# Patient Record
Sex: Female | Born: 1970 | Race: White | Hispanic: No | Smoking: Current every day smoker
Health system: Southern US, Community
[De-identification: ages and names within clinical notes are randomized; demographics above are authoritative.]

## PROBLEM LIST (undated history)

## (undated) DIAGNOSIS — R911 Solitary pulmonary nodule: Secondary | ICD-10-CM

## (undated) DIAGNOSIS — Q796 Ehlers-Danlos syndrome, unspecified: Secondary | ICD-10-CM

## (undated) HISTORY — PX: LIVER SURGERY: SHX698

## (undated) HISTORY — PX: APPENDECTOMY: SHX54

## (undated) HISTORY — PX: ABDOMINAL HYSTERECTOMY: SHX81

---

## 2008-05-17 ENCOUNTER — Emergency Department (HOSPITAL_COMMUNITY): Admission: EM | Admit: 2008-05-17 | Discharge: 2008-05-17 | Payer: Self-pay | Admitting: Emergency Medicine

## 2008-06-08 ENCOUNTER — Emergency Department (HOSPITAL_COMMUNITY): Admission: EM | Admit: 2008-06-08 | Discharge: 2008-06-08 | Payer: Self-pay | Admitting: Emergency Medicine

## 2009-04-10 ENCOUNTER — Emergency Department (HOSPITAL_COMMUNITY): Admission: EM | Admit: 2009-04-10 | Discharge: 2009-04-10 | Payer: Self-pay | Admitting: Emergency Medicine

## 2009-06-15 ENCOUNTER — Emergency Department (HOSPITAL_COMMUNITY): Admission: EM | Admit: 2009-06-15 | Discharge: 2009-06-15 | Payer: Self-pay | Admitting: Emergency Medicine

## 2009-11-09 ENCOUNTER — Encounter: Payer: Self-pay | Admitting: Internal Medicine

## 2009-11-10 ENCOUNTER — Ambulatory Visit: Payer: Self-pay | Admitting: Internal Medicine

## 2009-11-10 ENCOUNTER — Encounter (INDEPENDENT_AMBULATORY_CARE_PROVIDER_SITE_OTHER): Payer: Self-pay | Admitting: Internal Medicine

## 2009-11-10 ENCOUNTER — Inpatient Hospital Stay (HOSPITAL_COMMUNITY): Admission: EM | Admit: 2009-11-10 | Discharge: 2009-11-11 | Payer: Self-pay | Admitting: Emergency Medicine

## 2009-11-11 ENCOUNTER — Encounter: Payer: Self-pay | Admitting: Internal Medicine

## 2010-04-29 ENCOUNTER — Inpatient Hospital Stay (HOSPITAL_COMMUNITY): Admission: EM | Admit: 2010-04-29 | Discharge: 2010-05-01 | Payer: Self-pay | Admitting: Emergency Medicine

## 2010-04-29 ENCOUNTER — Encounter (INDEPENDENT_AMBULATORY_CARE_PROVIDER_SITE_OTHER): Payer: Self-pay | Admitting: Surgery

## 2010-05-03 ENCOUNTER — Ambulatory Visit: Payer: Self-pay | Admitting: Surgery

## 2010-05-03 ENCOUNTER — Encounter (INDEPENDENT_AMBULATORY_CARE_PROVIDER_SITE_OTHER): Payer: Self-pay | Admitting: Emergency Medicine

## 2010-05-03 ENCOUNTER — Emergency Department (HOSPITAL_COMMUNITY): Admission: EM | Admit: 2010-05-03 | Discharge: 2010-05-03 | Payer: Self-pay | Admitting: Emergency Medicine

## 2010-09-05 ENCOUNTER — Encounter: Admission: RE | Admit: 2010-09-05 | Discharge: 2010-09-05 | Payer: Self-pay | Admitting: Obstetrics

## 2010-09-09 ENCOUNTER — Inpatient Hospital Stay (HOSPITAL_COMMUNITY): Admission: AD | Admit: 2010-09-09 | Discharge: 2010-09-09 | Payer: Self-pay | Admitting: Obstetrics & Gynecology

## 2010-09-09 DIAGNOSIS — N938 Other specified abnormal uterine and vaginal bleeding: Secondary | ICD-10-CM

## 2010-09-09 DIAGNOSIS — N949 Unspecified condition associated with female genital organs and menstrual cycle: Secondary | ICD-10-CM

## 2010-09-09 DIAGNOSIS — D259 Leiomyoma of uterus, unspecified: Secondary | ICD-10-CM

## 2010-09-09 DIAGNOSIS — R109 Unspecified abdominal pain: Secondary | ICD-10-CM

## 2010-10-23 ENCOUNTER — Ambulatory Visit (HOSPITAL_COMMUNITY): Admission: RE | Admit: 2010-10-23 | Discharge: 2010-10-24 | Payer: Self-pay | Admitting: Obstetrics & Gynecology

## 2010-10-23 ENCOUNTER — Encounter (INDEPENDENT_AMBULATORY_CARE_PROVIDER_SITE_OTHER): Payer: Self-pay | Admitting: Obstetrics & Gynecology

## 2011-02-06 LAB — CBC
HCT: 31.1 % — ABNORMAL LOW (ref 36.0–46.0)
HCT: 36.4 % (ref 36.0–46.0)
Hemoglobin: 10.6 g/dL — ABNORMAL LOW (ref 12.0–15.0)
Hemoglobin: 12.4 g/dL (ref 12.0–15.0)
MCH: 30.5 pg (ref 26.0–34.0)
MCH: 30.7 pg (ref 26.0–34.0)
MCHC: 34 g/dL (ref 30.0–36.0)
MCHC: 34.1 g/dL (ref 30.0–36.0)
MCV: 89.9 fL (ref 78.0–100.0)
MCV: 90.2 fL (ref 78.0–100.0)
Platelets: 249 10*3/uL (ref 150–400)
Platelets: 268 10*3/uL (ref 150–400)
RBC: 3.46 MIL/uL — ABNORMAL LOW (ref 3.87–5.11)
RBC: 4.05 MIL/uL (ref 3.87–5.11)
RDW: 14.4 % (ref 11.5–15.5)
RDW: 14.7 % (ref 11.5–15.5)
WBC: 10.7 10*3/uL — ABNORMAL HIGH (ref 4.0–10.5)
WBC: 16 10*3/uL — ABNORMAL HIGH (ref 4.0–10.5)

## 2011-02-06 LAB — COMPREHENSIVE METABOLIC PANEL
ALT: 32 U/L (ref 0–35)
AST: 28 U/L (ref 0–37)
Albumin: 3.4 g/dL — ABNORMAL LOW (ref 3.5–5.2)
Alkaline Phosphatase: 64 U/L (ref 39–117)
BUN: 5 mg/dL — ABNORMAL LOW (ref 6–23)
CO2: 26 mEq/L (ref 19–32)
Calcium: 8.2 mg/dL — ABNORMAL LOW (ref 8.4–10.5)
Chloride: 102 mEq/L (ref 96–112)
Creatinine, Ser: 0.71 mg/dL (ref 0.4–1.2)
GFR calc Af Amer: >60 mL/min (ref >60)
GFR calc non Af Amer: >60 mL/min (ref >60)
Glucose, Bld: 81 mg/dL (ref 70–99)
Potassium: 3.5 mEq/L (ref 3.5–5.1)
Sodium: 136 mEq/L (ref 135–145)
Total Bilirubin: 0.3 mg/dL (ref 0.3–1.2)
Total Protein: 6.3 g/dL (ref 6.0–8.3)

## 2011-02-06 LAB — PREGNANCY, URINE: Preg Test, Ur: NEGATIVE

## 2011-02-06 LAB — TYPE AND SCREEN
ABO/RH(D): O POS
Antibody Screen: NEGATIVE

## 2011-02-06 LAB — SURGICAL PCR SCREEN
MRSA, PCR: NEGATIVE
Staphylococcus aureus: NEGATIVE

## 2011-02-06 LAB — ABO/RH: ABO/RH(D): O POS

## 2011-02-07 LAB — DIFFERENTIAL
Basophils Absolute: 0 10*3/uL (ref 0.0–0.1)
Basophils Relative: 0 % (ref 0–1)
Eosinophils Absolute: 0.2 10*3/uL (ref 0.0–0.7)
Eosinophils Relative: 3 % (ref 0–5)
Lymphocytes Relative: 26 % (ref 12–46)
Lymphs Abs: 2.3 10*3/uL (ref 0.7–4.0)
Monocytes Absolute: 0.6 10*3/uL (ref 0.1–1.0)
Monocytes Relative: 6 % (ref 3–12)
Neutro Abs: 5.9 10*3/uL (ref 1.7–7.7)
Neutrophils Relative %: 65 % (ref 43–77)

## 2011-02-07 LAB — CBC
HCT: 35.3 % — ABNORMAL LOW (ref 36.0–46.0)
Hemoglobin: 12 g/dL (ref 12.0–15.0)
MCH: 30.7 pg (ref 26.0–34.0)
MCHC: 34.1 g/dL (ref 30.0–36.0)
MCV: 90.2 fL (ref 78.0–100.0)
Platelets: 320 10*3/uL (ref 150–400)
RBC: 3.92 MIL/uL (ref 3.87–5.11)
RDW: 14.1 % (ref 11.5–15.5)
WBC: 9 10*3/uL (ref 4.0–10.5)

## 2011-02-12 LAB — COMPREHENSIVE METABOLIC PANEL
ALT: 44 U/L — ABNORMAL HIGH (ref 0–35)
AST: 21 U/L (ref 0–37)
AST: 28 U/L (ref 0–37)
Albumin: 3.8 g/dL (ref 3.5–5.2)
Albumin: 3.9 g/dL (ref 3.5–5.2)
Alkaline Phosphatase: 66 U/L (ref 39–117)
Alkaline Phosphatase: 73 U/L (ref 39–117)
BUN: 5 mg/dL — ABNORMAL LOW (ref 6–23)
BUN: 6 mg/dL (ref 6–23)
CO2: 28 mEq/L (ref 19–32)
CO2: 29 mEq/L (ref 19–32)
Calcium: 8.7 mg/dL (ref 8.4–10.5)
Chloride: 100 mEq/L (ref 96–112)
Chloride: 107 mEq/L (ref 96–112)
Creatinine, Ser: 0.6 mg/dL (ref 0.4–1.2)
Creatinine, Ser: 0.73 mg/dL (ref 0.4–1.2)
GFR calc Af Amer: >60 mL/min (ref >60)
GFR calc non Af Amer: >60 mL/min (ref >60)
GFR calc non Af Amer: >60 mL/min (ref >60)
Glucose, Bld: 104 mg/dL — ABNORMAL HIGH (ref 70–99)
Potassium: 3.6 mEq/L (ref 3.5–5.1)
Potassium: 3.8 mEq/L (ref 3.5–5.1)
Sodium: 139 mEq/L (ref 135–145)
Total Bilirubin: 0.4 mg/dL (ref 0.3–1.2)
Total Bilirubin: 0.6 mg/dL (ref 0.3–1.2)
Total Protein: 6.7 g/dL (ref 6.0–8.3)

## 2011-02-12 LAB — DIFFERENTIAL
Basophils Absolute: 0 10*3/uL (ref 0.0–0.1)
Basophils Absolute: 0 10*3/uL (ref 0.0–0.1)
Basophils Absolute: 0 10*3/uL (ref 0.0–0.1)
Basophils Relative: 0 % (ref 0–1)
Basophils Relative: 0 % (ref 0–1)
Basophils Relative: 0 % (ref 0–1)
Eosinophils Absolute: 0 10*3/uL (ref 0.0–0.7)
Eosinophils Relative: 0 % (ref 0–5)
Eosinophils Relative: 1 % (ref 0–5)
Eosinophils Relative: 2 % (ref 0–5)
Lymphocytes Relative: 28 % (ref 12–46)
Lymphocytes Relative: 30 % (ref 12–46)
Lymphs Abs: 0.7 10*3/uL (ref 0.7–4.0)
Monocytes Absolute: 0.5 10*3/uL (ref 0.1–1.0)
Neutro Abs: 4.3 10*3/uL (ref 1.7–7.7)
Neutro Abs: 5.1 10*3/uL (ref 1.7–7.7)
Neutrophils Relative %: 92 % — ABNORMAL HIGH (ref 43–77)

## 2011-02-12 LAB — URINALYSIS, ROUTINE W REFLEX MICROSCOPIC
Bilirubin Urine: NEGATIVE
Bilirubin Urine: NEGATIVE
Ketones, ur: NEGATIVE mg/dL
Nitrite: NEGATIVE
Nitrite: NEGATIVE
Protein, ur: NEGATIVE mg/dL
Protein, ur: NEGATIVE mg/dL
Specific Gravity, Urine: 1.006 (ref 1.005–1.030)
Urobilinogen, UA: 0.2 mg/dL (ref 0.0–1.0)
Urobilinogen, UA: 0.2 mg/dL (ref 0.0–1.0)
pH: 6.5 (ref 5.0–8.0)

## 2011-02-12 LAB — CBC
HCT: 31.1 % — ABNORMAL LOW (ref 36.0–46.0)
HCT: 31.2 % — ABNORMAL LOW (ref 36.0–46.0)
HCT: 33.2 % — ABNORMAL LOW (ref 36.0–46.0)
Hemoglobin: 10.5 g/dL — ABNORMAL LOW (ref 12.0–15.0)
MCHC: 33.7 g/dL (ref 30.0–36.0)
MCV: 90.7 fL (ref 78.0–100.0)
MCV: 91 fL (ref 78.0–100.0)
MCV: 91.3 fL (ref 78.0–100.0)
Platelets: 237 10*3/uL (ref 150–400)
Platelets: 254 10*3/uL (ref 150–400)
Platelets: 277 10*3/uL (ref 150–400)
RBC: 3.42 MIL/uL — ABNORMAL LOW (ref 3.87–5.11)
RBC: 3.64 MIL/uL — ABNORMAL LOW (ref 3.87–5.11)
RDW: 14.1 % (ref 11.5–15.5)
RDW: 14.2 % (ref 11.5–15.5)
WBC: 7 10*3/uL (ref 4.0–10.5)
WBC: 8 10*3/uL (ref 4.0–10.5)
WBC: 9.5 10*3/uL (ref 4.0–10.5)

## 2011-02-12 LAB — LIPASE, BLOOD
Lipase: 19 U/L (ref 11–59)
Lipase: 26 U/L (ref 11–59)

## 2011-02-12 LAB — URINE CULTURE

## 2011-02-12 LAB — PREGNANCY, URINE: Preg Test, Ur: NEGATIVE

## 2011-02-12 LAB — HEMOCCULT GUIAC POC 1CARD (OFFICE): Fecal Occult Bld: NEGATIVE

## 2011-02-26 LAB — CARDIAC PANEL(CRET KIN+CKTOT+MB+TROPI)
CK, MB: 0.6 ng/mL (ref 0.3–4.0)
CK, MB: 0.6 ng/mL (ref 0.3–4.0)
CK, MB: 0.7 ng/mL (ref 0.3–4.0)
Relative Index: INVALID (ref 0.0–2.5)
Total CK: 49 U/L (ref 7–177)
Total CK: 50 U/L (ref 7–177)
Troponin I: 0.01 ng/mL (ref 0.00–0.06)

## 2011-02-26 LAB — COMPREHENSIVE METABOLIC PANEL
Albumin: 3.5 g/dL (ref 3.5–5.2)
BUN: 6 mg/dL (ref 6–23)
Calcium: 7.9 mg/dL — ABNORMAL LOW (ref 8.4–10.5)
Creatinine, Ser: 0.74 mg/dL (ref 0.4–1.2)
Glucose, Bld: 88 mg/dL (ref 70–99)
Total Protein: 6.5 g/dL (ref 6.0–8.3)

## 2011-02-26 LAB — POTASSIUM: Potassium: 3.7 mEq/L (ref 3.5–5.1)

## 2011-02-26 LAB — CBC
HCT: 32.5 % — ABNORMAL LOW (ref 36.0–46.0)
Hemoglobin: 11.1 g/dL — ABNORMAL LOW (ref 12.0–15.0)
MCHC: 34.1 g/dL (ref 30.0–36.0)
Platelets: 206 10*3/uL (ref 150–400)
RDW: 13.8 % (ref 11.5–15.5)

## 2011-02-26 LAB — TSH: TSH: 5.48 u[IU]/mL — ABNORMAL HIGH (ref 0.350–4.500)

## 2011-02-27 LAB — POCT I-STAT, CHEM 8
Creatinine, Ser: 0.5 mg/dL (ref 0.4–1.2)
HCT: 39 % (ref 36.0–46.0)
Hemoglobin: 13.3 g/dL (ref 12.0–15.0)
Sodium: 142 mEq/L (ref 135–145)
TCO2: 24 mmol/L (ref 0–100)

## 2011-02-27 LAB — POCT CARDIAC MARKERS: Myoglobin, poc: 53.4 ng/mL (ref 12–200)

## 2011-02-27 LAB — D-DIMER, QUANTITATIVE: D-Dimer, Quant: 0.51 ug/mL-FEU — ABNORMAL HIGH (ref 0.00–0.48)

## 2011-06-23 ENCOUNTER — Emergency Department (HOSPITAL_COMMUNITY)
Admission: EM | Admit: 2011-06-23 | Discharge: 2011-06-24 | Disposition: A | Discharge status: Home / Self Care | Payer: 59 | Attending: Emergency Medicine | Admitting: Emergency Medicine

## 2011-06-23 DIAGNOSIS — R609 Edema, unspecified: Secondary | ICD-10-CM | POA: Insufficient documentation

## 2011-06-23 DIAGNOSIS — F17200 Nicotine dependence, unspecified, uncomplicated: Secondary | ICD-10-CM | POA: Insufficient documentation

## 2011-06-23 DIAGNOSIS — M5430 Sciatica, unspecified side: Secondary | ICD-10-CM | POA: Insufficient documentation

## 2011-06-23 DIAGNOSIS — I10 Essential (primary) hypertension: Secondary | ICD-10-CM | POA: Insufficient documentation

## 2011-08-20 ENCOUNTER — Emergency Department (HOSPITAL_COMMUNITY)
Admission: EM | Admit: 2011-08-20 | Discharge: 2011-08-21 | Disposition: A | Discharge status: Home / Self Care | Payer: 59 | Attending: Emergency Medicine | Admitting: Emergency Medicine

## 2011-08-20 DIAGNOSIS — F17200 Nicotine dependence, unspecified, uncomplicated: Secondary | ICD-10-CM | POA: Insufficient documentation

## 2011-08-20 DIAGNOSIS — M549 Dorsalgia, unspecified: Secondary | ICD-10-CM | POA: Insufficient documentation

## 2011-08-20 DIAGNOSIS — M62838 Other muscle spasm: Secondary | ICD-10-CM | POA: Insufficient documentation

## 2011-08-21 LAB — URINALYSIS, ROUTINE W REFLEX MICROSCOPIC
Hgb urine dipstick: NEGATIVE
Leukocytes, UA: NEGATIVE
Protein, ur: NEGATIVE mg/dL
Urobilinogen, UA: 0.2 mg/dL (ref 0.0–1.0)

## 2011-08-21 LAB — OCCULT BLOOD, POC DEVICE: Fecal Occult Bld: NEGATIVE

## 2011-08-23 LAB — POCT I-STAT, CHEM 8
BUN: 9
Calcium, Ion: 1.16
Chloride: 99
Creatinine, Ser: 1.1
Creatinine, Ser: 1
Glucose, Bld: 100 — ABNORMAL HIGH
Hemoglobin: 12.6
Sodium: 133 — ABNORMAL LOW
TCO2: 20

## 2011-08-23 LAB — URINALYSIS, ROUTINE W REFLEX MICROSCOPIC
Bilirubin Urine: NEGATIVE
Ketones, ur: NEGATIVE
Leukocytes, UA: NEGATIVE
Nitrite: NEGATIVE
Protein, ur: NEGATIVE
Urobilinogen, UA: 0.2

## 2011-08-23 LAB — CBC
HCT: 38.5
HCT: 33.6 — ABNORMAL LOW
Hemoglobin: 13.2
MCHC: 35.2
MCV: 89.2
Platelets: 262
RBC: 4.22
RDW: 13.4
WBC: 8.1
WBC: 11.6 — ABNORMAL HIGH

## 2011-08-23 LAB — DIFFERENTIAL
Basophils Absolute: 0.2 — ABNORMAL HIGH
Basophils Relative: 0
Eosinophils Absolute: 0
Eosinophils Relative: 0
Lymphocytes Relative: 27
Lymphs Abs: 2.2
Lymphs Abs: 1
Monocytes Absolute: 0.4
Monocytes Relative: 5
Monocytes Relative: 4
Neutro Abs: 5.1

## 2011-08-23 LAB — RAPID URINE DRUG SCREEN, HOSP PERFORMED
Amphetamines: NOT DETECTED
Tetrahydrocannabinol: NOT DETECTED

## 2011-08-23 LAB — PREGNANCY, URINE: Preg Test, Ur: NEGATIVE

## 2011-09-04 ENCOUNTER — Emergency Department (HOSPITAL_COMMUNITY)
Admission: EM | Admit: 2011-09-04 | Discharge: 2011-09-04 | Disposition: A | Discharge status: Home / Self Care | Payer: 59 | Attending: Emergency Medicine | Admitting: Emergency Medicine

## 2011-09-04 DIAGNOSIS — R10819 Abdominal tenderness, unspecified site: Secondary | ICD-10-CM | POA: Insufficient documentation

## 2011-09-04 DIAGNOSIS — F17200 Nicotine dependence, unspecified, uncomplicated: Secondary | ICD-10-CM | POA: Insufficient documentation

## 2011-09-04 DIAGNOSIS — R109 Unspecified abdominal pain: Secondary | ICD-10-CM | POA: Insufficient documentation

## 2011-09-04 DIAGNOSIS — M545 Low back pain, unspecified: Secondary | ICD-10-CM | POA: Insufficient documentation

## 2011-09-04 LAB — URINALYSIS, ROUTINE W REFLEX MICROSCOPIC
Glucose, UA: NEGATIVE mg/dL
Ketones, ur: NEGATIVE mg/dL
Leukocytes, UA: NEGATIVE
Nitrite: NEGATIVE
Protein, ur: NEGATIVE mg/dL

## 2011-09-04 LAB — CBC
MCH: 29.8 pg (ref 26.0–34.0)
MCHC: 32.8 g/dL (ref 30.0–36.0)
Platelets: 253 10*3/uL (ref 150–400)
RDW: 13.4 % (ref 11.5–15.5)

## 2011-09-04 LAB — DIFFERENTIAL
Basophils Relative: 0 % (ref 0–1)
Eosinophils Absolute: 0.1 10*3/uL (ref 0.0–0.7)
Eosinophils Relative: 1 % (ref 0–5)
Monocytes Absolute: 0.4 10*3/uL (ref 0.1–1.0)
Monocytes Relative: 4 % (ref 3–12)

## 2011-09-04 LAB — COMPREHENSIVE METABOLIC PANEL
AST: 19 U/L (ref 0–37)
CO2: 24 mEq/L (ref 19–32)
Calcium: 9.5 mg/dL (ref 8.4–10.5)
Creatinine, Ser: 0.59 mg/dL (ref 0.50–1.10)
GFR calc non Af Amer: >90 mL/min (ref >90)

## 2011-09-09 ENCOUNTER — Emergency Department (HOSPITAL_COMMUNITY)
Admission: EM | Admit: 2011-09-09 | Discharge: 2011-09-10 | Disposition: A | Discharge status: Home / Self Care | Payer: 59 | Attending: Emergency Medicine | Admitting: Emergency Medicine

## 2011-09-09 ENCOUNTER — Emergency Department (HOSPITAL_COMMUNITY): Payer: 59

## 2011-09-09 DIAGNOSIS — N83209 Unspecified ovarian cyst, unspecified side: Secondary | ICD-10-CM | POA: Insufficient documentation

## 2011-09-09 DIAGNOSIS — R109 Unspecified abdominal pain: Secondary | ICD-10-CM | POA: Insufficient documentation

## 2011-09-09 DIAGNOSIS — F17200 Nicotine dependence, unspecified, uncomplicated: Secondary | ICD-10-CM | POA: Insufficient documentation

## 2011-09-09 LAB — URINALYSIS, ROUTINE W REFLEX MICROSCOPIC
Glucose, UA: NEGATIVE mg/dL
Leukocytes, UA: NEGATIVE
Nitrite: NEGATIVE
Protein, ur: NEGATIVE mg/dL
Urobilinogen, UA: 0.2 mg/dL (ref 0.0–1.0)

## 2011-09-09 LAB — DIFFERENTIAL
Basophils Absolute: 0 10*3/uL (ref 0.0–0.1)
Basophils Relative: 0 % (ref 0–1)
Eosinophils Absolute: 0.2 10*3/uL (ref 0.0–0.7)
Lymphs Abs: 2.6 10*3/uL (ref 0.7–4.0)
Neutrophils Relative %: 68 % (ref 43–77)

## 2011-09-09 LAB — COMPREHENSIVE METABOLIC PANEL
ALT: 24 U/L (ref 0–35)
AST: 22 U/L (ref 0–37)
Albumin: 3.8 g/dL (ref 3.5–5.2)
CO2: 27 mEq/L (ref 19–32)
Chloride: 100 mEq/L (ref 96–112)
GFR calc non Af Amer: >90 mL/min (ref >90)
Potassium: 3.1 mEq/L — ABNORMAL LOW (ref 3.5–5.1)
Sodium: 136 mEq/L (ref 135–145)
Total Bilirubin: 0.2 mg/dL — ABNORMAL LOW (ref 0.3–1.2)

## 2011-09-09 LAB — CBC
Platelets: 260 10*3/uL (ref 150–400)
RBC: 3.97 MIL/uL (ref 3.87–5.11)
WBC: 10.6 10*3/uL — ABNORMAL HIGH (ref 4.0–10.5)

## 2011-09-10 ENCOUNTER — Emergency Department (HOSPITAL_COMMUNITY): Payer: 59

## 2011-09-10 ENCOUNTER — Encounter (HOSPITAL_COMMUNITY): Payer: Self-pay

## 2011-09-10 LAB — URINE CULTURE
Colony Count: NO GROWTH
Culture: NO GROWTH

## 2011-09-10 MED ORDER — IOHEXOL 300 MG/ML  SOLN
100.0000 mL | Freq: Once | INTRAMUSCULAR | Status: AC | PRN
  Administered 2011-09-10: 100 mL via INTRAVENOUS

## 2011-09-30 ENCOUNTER — Emergency Department (HOSPITAL_COMMUNITY): Admission: EM | Admit: 2011-09-30 | Discharge: 2011-09-30 | Discharge status: Against Medical Advice | Payer: 59

## 2011-10-27 ENCOUNTER — Emergency Department (HOSPITAL_BASED_OUTPATIENT_CLINIC_OR_DEPARTMENT_OTHER)
Admission: EM | Admit: 2011-10-27 | Discharge: 2011-10-28 | Disposition: A | Discharge status: Home / Self Care | Payer: 59 | Attending: Emergency Medicine | Admitting: Emergency Medicine

## 2011-10-27 ENCOUNTER — Encounter (HOSPITAL_BASED_OUTPATIENT_CLINIC_OR_DEPARTMENT_OTHER): Payer: Self-pay | Admitting: *Deleted

## 2011-10-27 DIAGNOSIS — F17200 Nicotine dependence, unspecified, uncomplicated: Secondary | ICD-10-CM | POA: Insufficient documentation

## 2011-10-27 DIAGNOSIS — S139XXA Sprain of joints and ligaments of unspecified parts of neck, initial encounter: Secondary | ICD-10-CM | POA: Insufficient documentation

## 2011-10-27 DIAGNOSIS — S161XXA Strain of muscle, fascia and tendon at neck level, initial encounter: Secondary | ICD-10-CM

## 2011-10-27 DIAGNOSIS — Y92410 Unspecified street and highway as the place of occurrence of the external cause: Secondary | ICD-10-CM | POA: Insufficient documentation
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
## 2011-10-28 ENCOUNTER — Emergency Department (INDEPENDENT_AMBULATORY_CARE_PROVIDER_SITE_OTHER): Payer: 59

## 2011-10-28 DIAGNOSIS — M542 Cervicalgia: Secondary | ICD-10-CM

## 2011-10-28 MED ORDER — KETOROLAC TROMETHAMINE 60 MG/2ML IM SOLN
60.0000 mg | Freq: Once | INTRAMUSCULAR | Status: AC
  Administered 2011-10-28: 60 mg via INTRAMUSCULAR
  Filled 2011-10-28: qty 2

## 2011-10-28 MED ORDER — NAPROXEN 500 MG PO TABS: 500.0000 mg | ORAL_TABLET | Freq: Two times a day (BID) | ORAL | Status: DC

## 2011-10-28 MED ORDER — METHOCARBAMOL 500 MG PO TABS: 500.0000 mg | ORAL_TABLET | Freq: Two times a day (BID) | ORAL | Status: AC | PRN

## 2011-10-28 MED ORDER — HYDROCODONE-ACETAMINOPHEN 5-500 MG PO TABS: 1.0000 | ORAL_TABLET | Freq: Four times a day (QID) | ORAL | Status: AC | PRN
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
## 2012-01-30 ENCOUNTER — Emergency Department (HOSPITAL_COMMUNITY)
Admission: EM | Admit: 2012-01-30 | Discharge: 2012-01-30 | Disposition: A | Discharge status: Home / Self Care | Payer: 59 | Attending: Emergency Medicine | Admitting: Emergency Medicine

## 2012-01-30 ENCOUNTER — Other Ambulatory Visit: Payer: Self-pay

## 2012-01-30 ENCOUNTER — Encounter (HOSPITAL_COMMUNITY): Payer: Self-pay | Admitting: Family Medicine

## 2012-01-30 DIAGNOSIS — M542 Cervicalgia: Secondary | ICD-10-CM | POA: Insufficient documentation

## 2012-01-30 DIAGNOSIS — M25519 Pain in unspecified shoulder: Secondary | ICD-10-CM | POA: Insufficient documentation

## 2012-01-30 DIAGNOSIS — R209 Unspecified disturbances of skin sensation: Secondary | ICD-10-CM | POA: Insufficient documentation

## 2012-01-30 DIAGNOSIS — F17200 Nicotine dependence, unspecified, uncomplicated: Secondary | ICD-10-CM | POA: Insufficient documentation

## 2012-01-30 DIAGNOSIS — M62838 Other muscle spasm: Secondary | ICD-10-CM | POA: Insufficient documentation

## 2012-01-30 MED ORDER — HYDROMORPHONE HCL PF 2 MG/ML IJ SOLN
2.0000 mg | Freq: Once | INTRAMUSCULAR | Status: AC
  Administered 2012-01-30: 2 mg via INTRAMUSCULAR
  Filled 2012-01-30: qty 1

## 2012-01-30 MED ORDER — OXYCODONE-ACETAMINOPHEN 5-325 MG PO TABS: 1.0000 | ORAL_TABLET | ORAL | Status: DC | PRN

## 2012-01-30 NOTE — ED Provider Notes (Signed)
History     CSN: [id number]  Arrival date & time 01/30/12  4098   First MD Initiated Contact with Patient 01/30/12 1846      Chief Complaint  Patient presents with  . Neck Pain  . Shoulder Pain  . Numbness     Patient is a 41 y.o. female presenting with neck pain and shoulder pain. The history is provided by the patient.  Neck Pain  This is a new problem. Episode onset: today. The problem occurs constantly. The problem has been gradually worsening. The pain is associated with nothing. There has been no fever. The pain is present in the left side. The pain radiates to the left shoulder. The pain is severe. The symptoms are aggravated by position, twisting and bending. The pain is the same all the time. Associated symptoms include numbness and tingling. Pertinent negatives include no visual change, no chest pain, no syncope, no headaches, no bowel incontinence, no bladder incontinence, no leg pain, no paresis and no weakness. She has tried neck support and NSAIDs for the symptoms. The treatment provided no relief.  Shoulder Pain Pertinent negatives include no chest pain and no headaches.  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 Abdominal hysterectomy   . Appendectomy   . Liver surgery     History reviewed. No pertinent family history.  History  Substance Use Topics  . Smoking status: Current Everyday Smoker  . Smokeless tobacco: Not on file  . Alcohol Use: No    OB History    Grav Para Term Preterm Abortions TAB SAB Ect Mult Living                  Review of Systems  HENT: Positive for neck pain.   Cardiovascular: Negative for chest pain and syncope.  Gastrointestinal: Negative for bowel incontinence.  Genitourinary: Negative for  bladder incontinence.  Neurological: Positive for tingling and numbness. Negative for weakness and headaches.  All other systems reviewed and are negative.    Allergies  Review of patient's allergies indicates no known allergies.  Home Medications   Current Outpatient Rx  Name Route Sig Dispense Refill  . IBUPROFEN 200 MG PO TABS Oral Take 400 mg by mouth every 6 (six) hours as needed. For pain     . OXYCODONE-ACETAMINOPHEN 5-325 MG PO TABS Oral Take 1 tablet by mouth every 4 (four) hours as needed for pain. 15 tablet 0    BP 152/103  Pulse 78  Temp(Src) 98.1 F (36.7 C) (Oral)  Resp 16  SpO2 99%  LMP 09/26/2010  Physical Exam CONSTITUTIONAL: Well developed/well nourished, uncomfortable appearing HEAD AND FACE: Normocephalic/atraumatic EYES: EOMI/PERRL ENMT: Mucous membranes moist NECK: supple no meningeal signs SPINE:entire spine nontender, cervical paraspinal tenderness/spasm noted.  No erythema/crepitance/abscess noted CV: S1/S2 noted, no murmurs/rubs/gallops noted LUNGS: Lungs are clear to auscultation bilaterally, no apparent distress ABDOMEN: soft, nontender, no rebound or guarding GU:no cva tenderness NEURO: Equal power with hand grip, wrist flex/extension, elbow flex/extension, and equal power with shoulder abduction/adduction.  Equal biceps/brachioradial reflex in bilateral UE Gait normal No focal motor deficits noted in the LE EXTREMITIES: pulses normal, full ROM SKIN: warm, color normal PSYCH: anxious  ED Course  Procedures     1. Cervical  paraspinal muscle spasm     7:49 PM Pt improved, ambulatory, no focal motor deficits noted in her extremities.  Suspect muscle spasm/strain She is low risk for acute neurologic emergency We discussed strict return precautions and   The patient appears reasonably screened and/or stabilized for discharge and I doubt any other medical condition or other [facility] requiring further screening, evaluation, or treatment in the  ED at this time prior to discharge.   MDM  Nursing notes reviewed and considered in documentation Previous records reviewed and considered      Date: 01/30/2012  Rate: 79  Rhythm: normal sinus rhythm  QRS Axis: normal  Intervals: normal  ST/T Wave abnormalities: normal  Conduction Disutrbances:nonspecific intraventricular conduction delay  Narrative Interpretation:   Old EKG Reviewed: unchanged     Joya Gaskins, MD 01/30/12 1950

## 2012-01-30 NOTE — ED Notes (Signed)
Pt sts today sudden onset of left neck, arm, and shoulder pain. Hurts with movement. Lift arm. Denies injury

## 2012-01-30 NOTE — Discharge Instructions (Signed)
You have neck pain, possibly from a cervical strain and/or pinched nerve.  ° °SEEK IMMEDIATE MEDICAL ATTENTION IF: °You develop difficulties swallowing or breathing.  °You have new or worse numbness, weakness, tingling, or movement problems in your arms or legs.  °You develop increasing pain which is uncontrolled with medications.  °You have change in bowel or bladder function, or other concerns. ° ° ° °

## 2012-01-31 ENCOUNTER — Emergency Department (INDEPENDENT_AMBULATORY_CARE_PROVIDER_SITE_OTHER): Payer: 59

## 2012-01-31 ENCOUNTER — Encounter (HOSPITAL_BASED_OUTPATIENT_CLINIC_OR_DEPARTMENT_OTHER): Payer: Self-pay

## 2012-01-31 ENCOUNTER — Emergency Department (HOSPITAL_BASED_OUTPATIENT_CLINIC_OR_DEPARTMENT_OTHER)
Admission: EM | Admit: 2012-01-31 | Discharge: 2012-01-31 | Disposition: A | Discharge status: Home Health | Payer: 59 | Attending: Emergency Medicine | Admitting: Emergency Medicine

## 2012-01-31 DIAGNOSIS — M542 Cervicalgia: Secondary | ICD-10-CM | POA: Insufficient documentation

## 2012-01-31 DIAGNOSIS — M25519 Pain in unspecified shoulder: Secondary | ICD-10-CM

## 2012-01-31 DIAGNOSIS — M25419 Effusion, unspecified shoulder: Secondary | ICD-10-CM | POA: Insufficient documentation

## 2012-01-31 MED ORDER — IBUPROFEN 800 MG PO TABS
800.0000 mg | ORAL_TABLET | Freq: Once | ORAL | Status: AC
  Administered 2012-01-31: 800 mg via ORAL
  Filled 2012-01-31: qty 1

## 2012-01-31 MED ORDER — ONDANSETRON 8 MG PO TBDP
8.0000 mg | ORAL_TABLET | Freq: Once | ORAL | Status: AC
  Administered 2012-01-31: 8 mg via ORAL
  Filled 2012-01-31: qty 1

## 2012-01-31 MED ORDER — KETOROLAC TROMETHAMINE 60 MG/2ML IM SOLN
60.0000 mg | Freq: Once | INTRAMUSCULAR | Status: AC
  Administered 2012-01-31: 60 mg via INTRAMUSCULAR
  Filled 2012-01-31: qty 2

## 2012-01-31 MED ORDER — PREDNISONE 10 MG PO TABS: 20.0000 mg | ORAL_TABLET | Freq: Every day | ORAL | Status: DC

## 2012-01-31 MED ORDER — CYCLOBENZAPRINE HCL 10 MG PO TABS: 5.0000 mg | ORAL_TABLET | Freq: Two times a day (BID) | ORAL | Status: DC | PRN

## 2012-01-31 NOTE — ED Notes (Signed)
Was seen at [facility] ED for same yesterday

## 2012-01-31 NOTE — ED Provider Notes (Addendum)
History     CSN: [id number]  Arrival date & time 01/31/12  1105   First MD Initiated Contact with Patient 01/31/12 1125      Chief Complaint  Patient presents with  . Neck Pain    (Consider location/radiation/quality/duration/timing/severity/associated sxs/prior treatment) HPI  Patient with neck pain began yesterday on awakening.  Patient seen at [facility] and diagnosed with cervical paraspinal muscle spasm.  Patient states pain down arm and now feel "knot" on left shoulder.  EKG done yesterday reported normal.  Patient given s History reviewed. No pertinent past medical history.  Past Surgical History  Procedure Date  . Abdominal hysterectomy   . Appendectomy   . Liver surgery     No family history on file.  History  Substance Use Topics  . Smoking status: Current Everyday Smoker  . Smokeless tobacco: Not on file  . Alcohol Use: No    OB History    Grav Para Term Preterm Abortions TAB SAB Ect Mult Living                  Review of Systems  All other systems reviewed and are negative.    Allergies  Review of patient's allergies indicates no known allergies.  Home Medications   Current Outpatient Rx  Name Route Sig Dispense Refill  . IBUPROFEN 200 MG PO TABS Oral Take 400 mg by mouth every 6 (six) hours as needed. For pain     . OXYCODONE-ACETAMINOPHEN 5-325 MG PO TABS Oral Take 1 tablet by mouth every 4 (four) hours as needed for pain. 15 tablet 0    BP 147/87  Pulse 74  Temp(Src) 97.6 F (36.4 C) (Oral)  Resp 20  Ht 5\' 8"  (1.727 m)  Wt 190 lb (86.183 kg)  BMI 28.89 kg/m2  SpO2 100%  LMP 09/26/2010  Physical Exam  Nursing note and vitals reviewed. Constitutional: She is oriented to person, place, and time. She appears well-developed and well-nourished. She appears distressed.       Patient crying on exam  HENT:  Head: Normocephalic and atraumatic.  Eyes: Conjunctivae and EOM are normal. Pupils are equal, round, and reactive to light.  Neck: Normal  range of motion. Neck supple.  Cardiovascular: Normal rate.   Pulmonary/Chest: Effort normal.  Abdominal: Soft.  Musculoskeletal:       Patient tender with lateral extension of left shoulder No sign of trauma or deformity. No redness or swelling. Radial pulses are 2+. There is noted decrease in patient or strength.  Neurological: She is alert and oriented to person, place, and time.  Skin: Skin is warm and dry.  Psychiatric:       tearful    ED Course  Procedures (including critical care time)  Labs Reviewed - No data to display No results found.   No diagnosis found.    MDM  Dg Shoulder Left  01/31/2012  *RADIOLOGY REPORT*  Clinical Data: Shoulder pain  LEFT SHOULDER - 2+ VIEW  Comparison: None.  Findings: No acute fracture and no dislocation.  Unremarkable soft tissues  IMPRESSION: No acute bony pathology.  Original Report Authenticated By: Donavan Burnet, M.D.       Patient without any evidence of acute injury or fracture. The pain seems localized to her left shoulder. She's moving her neck without difficulty. She is placed on prednisone and muscle relaxants.     Hilario Quarry, MD 01/31/12 1240  Hilario Quarry, MD 01/31/12 1246

## 2012-01-31 NOTE — Discharge Instructions (Signed)
Please use sling and ice.  Follow up with Dr. Shon Baton if continued pain.  Take medication as prescribed.

## 2012-01-31 NOTE — ED Notes (Signed)
Woke with pain to posterior neck yesterday-c/o numbness to left arm today am-pain to arm with movement-also c/o n/v started last night

## 2012-02-01 ENCOUNTER — Observation Stay (HOSPITAL_COMMUNITY)
Admission: EM | Admit: 2012-02-01 | Discharge: 2012-02-02 | Disposition: A | Discharge status: Home / Self Care | Payer: 59 | Attending: Emergency Medicine | Admitting: Emergency Medicine

## 2012-02-01 ENCOUNTER — Encounter (HOSPITAL_COMMUNITY): Payer: Self-pay | Admitting: *Deleted

## 2012-02-01 DIAGNOSIS — IMO0002 Reserved for concepts with insufficient information to code with codable children: Secondary | ICD-10-CM

## 2012-02-01 DIAGNOSIS — M542 Cervicalgia: Secondary | ICD-10-CM | POA: Insufficient documentation

## 2012-02-01 DIAGNOSIS — M5020 Other cervical disc displacement, unspecified cervical region: Principal | ICD-10-CM | POA: Insufficient documentation

## 2012-02-01 DIAGNOSIS — F17200 Nicotine dependence, unspecified, uncomplicated: Secondary | ICD-10-CM | POA: Insufficient documentation

## 2012-02-01 LAB — DIFFERENTIAL
Basophils Absolute: 0 10*3/uL (ref 0.0–0.1)
Basophils Relative: 0 % (ref 0–1)
Eosinophils Absolute: 0.2 10*3/uL (ref 0.0–0.7)
Eosinophils Relative: 2 % (ref 0–5)
Lymphocytes Relative: 34 % (ref 12–46)
Lymphs Abs: 3.1 10*3/uL (ref 0.7–4.0)
Monocytes Absolute: 0.3 10*3/uL (ref 0.1–1.0)
Monocytes Relative: 4 % (ref 3–12)
Neutro Abs: 5.5 10*3/uL (ref 1.7–7.7)
Neutrophils Relative %: 60 % (ref 43–77)

## 2012-02-01 LAB — BASIC METABOLIC PANEL
BUN: 7 mg/dL (ref 6–23)
CO2: 24 mEq/L (ref 19–32)
Calcium: 9 mg/dL (ref 8.4–10.5)
Chloride: 103 mEq/L (ref 96–112)
Creatinine, Ser: 0.68 mg/dL (ref 0.50–1.10)
GFR calc Af Amer: >90 mL/min (ref >90)
GFR calc non Af Amer: >90 mL/min (ref >90)
Glucose, Bld: 86 mg/dL (ref 70–99)
Potassium: 3.8 mEq/L (ref 3.5–5.1)
Sodium: 139 mEq/L (ref 135–145)

## 2012-02-01 LAB — CBC
HCT: 37.2 % (ref 36.0–46.0)
Hemoglobin: 12.8 g/dL (ref 12.0–15.0)
MCH: 31.2 pg (ref 26.0–34.0)
MCHC: 34.4 g/dL (ref 30.0–36.0)
MCV: 90.7 fL (ref 78.0–100.0)
Platelets: 241 10*3/uL (ref 150–400)
RBC: 4.1 MIL/uL (ref 3.87–5.11)
RDW: 13.5 % (ref 11.5–15.5)
WBC: 9.1 10*3/uL (ref 4.0–10.5)

## 2012-02-01 MED ORDER — ONDANSETRON HCL 4 MG/2ML IJ SOLN
4.0000 mg | Freq: Four times a day (QID) | INTRAMUSCULAR | Status: DC | PRN
  Administered 2012-02-02: 4 mg via INTRAVENOUS
  Filled 2012-02-01 (×2): qty 2

## 2012-02-01 MED ORDER — ONDANSETRON 4 MG PO TBDP
8.0000 mg | ORAL_TABLET | Freq: Once | ORAL | Status: AC
  Administered 2012-02-01: 8 mg via ORAL
  Filled 2012-02-01: qty 2

## 2012-02-01 MED ORDER — HYDROMORPHONE HCL PF 2 MG/ML IJ SOLN
2.0000 mg | Freq: Once | INTRAMUSCULAR | Status: AC
  Administered 2012-02-01: 2 mg via INTRAMUSCULAR
  Filled 2012-02-01: qty 1

## 2012-02-01 MED ORDER — SODIUM CHLORIDE 0.9 % IV SOLN
INTRAVENOUS | Status: DC
  Administered 2012-02-01 – 2012-02-02 (×3): via INTRAVENOUS

## 2012-02-01 MED ORDER — DIAZEPAM 5 MG PO TABS
5.0000 mg | ORAL_TABLET | Freq: Once | ORAL | Status: AC
  Administered 2012-02-01: 5 mg via ORAL
  Filled 2012-02-01: qty 1

## 2012-02-01 MED ORDER — HYDROMORPHONE HCL PF 1 MG/ML IJ SOLN
1.0000 mg | INTRAMUSCULAR | Status: DC | PRN
Start: 2012-02-01 — End: 2012-02-02
  Administered 2012-02-01 – 2012-02-02 (×5): 1 mg via INTRAVENOUS
  Filled 2012-02-01 (×5): qty 1

## 2012-02-01 NOTE — ED Notes (Signed)
Pt is aware that she does need to provide a urine sample

## 2012-02-01 NOTE — ED Notes (Signed)
Neck pain for 7 days.  No known injury.  She was seen here earlier int the week here and referred to dr Jeral Fruit.  She has an appointment but she has not been given any pain meds.  She is in pain

## 2012-02-02 ENCOUNTER — Observation Stay (HOSPITAL_COMMUNITY): Payer: 59

## 2012-02-02 LAB — URINALYSIS, ROUTINE W REFLEX MICROSCOPIC
Bilirubin Urine: NEGATIVE
Glucose, UA: NEGATIVE mg/dL
Hgb urine dipstick: NEGATIVE
Ketones, ur: NEGATIVE mg/dL
Leukocytes, UA: NEGATIVE
Nitrite: NEGATIVE
Protein, ur: NEGATIVE mg/dL
Specific Gravity, Urine: 1.014 (ref 1.005–1.030)
Urobilinogen, UA: 0.2 mg/dL (ref 0.0–1.0)
pH: 5 (ref 5.0–8.0)

## 2012-02-02 MED ORDER — PREDNISONE (PAK) 10 MG PO TABS: 10.0000 mg | ORAL_TABLET | ORAL | Status: DC

## 2012-02-02 MED ORDER — PREDNISONE (PAK) 10 MG PO TABS: 10.0000 mg | ORAL_TABLET | Freq: Four times a day (QID) | ORAL | Status: DC

## 2012-02-02 MED ORDER — GADOBENATE DIMEGLUMINE 529 MG/ML IV SOLN
20.0000 mL | Freq: Once | INTRAVENOUS | Status: AC | PRN
  Administered 2012-02-02: 20 mL via INTRAVENOUS

## 2012-02-02 MED ORDER — DEXAMETHASONE SODIUM PHOSPHATE 10 MG/ML IJ SOLN
10.0000 mg | Freq: Once | INTRAMUSCULAR | Status: AC
  Administered 2012-02-02: 10 mg via INTRAVENOUS
  Filled 2012-02-02: qty 1

## 2012-02-02 MED ORDER — KETOROLAC TROMETHAMINE 30 MG/ML IJ SOLN
30.0000 mg | Freq: Once | INTRAMUSCULAR | Status: AC
  Administered 2012-02-02: 30 mg via INTRAVENOUS
  Filled 2012-02-02: qty 1

## 2012-02-02 MED ORDER — PREDNISONE (PAK) 10 MG PO TABS: 20.0000 mg | ORAL_TABLET | Freq: Every evening | ORAL | Status: DC

## 2012-02-02 MED ORDER — PREDNISONE (PAK) 10 MG PO TABS: ORAL_TABLET | ORAL | Status: DC

## 2012-02-02 MED ORDER — PREDNISONE (PAK) 10 MG PO TABS: 10.0000 mg | ORAL_TABLET | Freq: Three times a day (TID) | ORAL | Status: DC

## 2012-02-02 MED ORDER — OXYCODONE-ACETAMINOPHEN 5-325 MG PO TABS: 1.0000 | ORAL_TABLET | ORAL | Status: DC | PRN

## 2012-02-02 MED ORDER — PREDNISONE (PAK) 10 MG PO TABS: 20.0000 mg | ORAL_TABLET | Freq: Every morning | ORAL | Status: DC

## 2012-02-02 NOTE — Discharge Instructions (Signed)
Please follow up with Dr. Danielle Dess as he has instructed. Return to the ED for worsening condition.  Herniated Disk The bones of your spinal column (vertebrae) protect your spinal cord and nerves that go into your arms and legs. The vertebrae are separated by disks that cushion the spinal column and put space between your vertebrae. This allows movement between the vertebrae, which allows you to bend, rotate, and move your body from side to side. Sometimes, the disks move out of place (herniate) or break open (rupture) from injury or strain. The most common area for a disk herniation is in the lower back (lumbar area). Sometimes herniation occurs in the neck (cervical) disks.  CAUSES  As we grow older, the strong, fibrous cords that connect the vertebrae and support and surround the disks (ligaments) start to weaken. A strain on the back may cause a break in the disk ligaments. RISK FACTORS Herniated disks occur most often in men who are aged 18 years to 35 years, usually after strenuous activity. Other risk factors include conditions present at birth (congenital) that affect the size of the lumbar spinal canal. Additionally, a narrowing of the areas where the nerves exit the spinal canal can occur as you age. SYMPTOMS  Symptoms of a herniated disk vary. You may have weakness in certain muscles. This weakness can include difficulty lifting your leg or arm, difficulty standing on your toes on one side, or difficulty squeezing tightly with one of your hands. You may have numbness. You may feel a mild tingling, dull ache, or a burning or pulsating pain. In some cases, the pain is severe enough that you are unable to move. The pain most often occurs on one side of the body. The pain often starts slowly. It may get worse:  After you sit or stand.   At night.   When you sneeze, cough, or laugh.   When you bend backwards or walk more than a few yards.  The pain, numbness, or weakness will often go away or  improve a lot over a period of weeks to months. Herniated lumbar disk Symptoms of a herniated lumbar disk may include sharp pain in one part of your leg, hip, or buttocks and numbness in other parts. You also may feel pain or numbness on the back of your calf or the top or sole of your foot. The same leg also may feel weak. Herniated cervical disk Symptoms of a herniated cervical disk may include pain when you move your neck, deep pain near or over your shoulder blade, or pain that moves to your upper arm, forearm, or fingers. DIAGNOSIS  To diagnose a herniated disk, your caregiver will perform a physical exam. Your caregiver also may perform diagnostic tests to see your disk or to test the reaction of your muscles and the function of your nerves. During the physical exam, your caregiver may ask you to:  Sit, stand, and walk. While you walk, your caregiver may ask you to try walking on your toes and then your heels.   Bend forward, backward, and sideways.   Raise your shoulders, elbow, wrist, and fingers and check your strength during these tasks.  Your caregiver will check for:  Numbness or loss of feeling.   Muscle reflexes, which may be slower or missing.   Muscle strength, which may be weaker.   Posture or the way your spine curves.  Diagnostic tests that may be done include:  A spinal X-ray exam to rule out other  causes of back pain.   Magnetic resonance imaging (MRI) or computed tomography (CT) scan, which will show if the herniated disk is pressing on your spinal canal.   Electromyography. This is sometimes used to identify the specific area of nerve involvement.  TREATMENT  Initial treatment for a herniated disk is a short period of rest with medicines for pain. Pain medicines can include nonsteroidal anti-inflammatory medicines (NSAIDs), muscle relaxants for back spasms, and (rarely) narcotic pain medicine for severe pain that does not respond to NSAID use. Bed rest is often  limited to 1 or 2 days at the most because prolonged rest can delay recovery. When the herniation involves the lower back, sitting should be avoided as much as possible because sitting increases pressure on the ruptured disk. Sometimes a soft neck collar will be prescribed for a few days to weeks to help support your neck in the case of a cervical herniation. Physical therapy is often prescribed for patients with disk disease. Physical therapists will teach you how to properly lift, dress, walk, and perform other activities. They will work on strengthening the muscles that help support your spine. In some cases, physical therapy alone is not enough to treat a herniated disk. Steroid injections along the involved nerve root may be needed to help control pain. The steroid is injected in the area of the herniated disk and helps by reducing swelling around the disk. Sometimes surgery is the best option to treat a herniated disk.  SEEK IMMEDIATE MEDICAL CARE IF:   You have numbness, tingling, weakness, or problems with the use of your arms or legs.   You have severe headaches that are not relieved with the use of medicines.   You notice a change in your bowel or bladder control.   You have increasing pain in any areas of your body.   You experience shortness of breath, dizziness, or fainting.  MAKE SURE YOU:   Understand these instructions.   Will watch your condition.   Will get help right away if you are not doing well or get worse.  Document Released: 11/09/2000 Document Revised: 11/01/2011 Document Reviewed: 06/15/2011 [facility] Patient Information 2012 [place], [place].

## 2012-02-02 NOTE — ED Provider Notes (Cosign Needed)
Patient has been in the CDU overnight for neck pain that started about a week ago. Patient states that she started getting worsening numbness in her left palm, left index finger left middle finger yesterday. She also states her left hand feels weak and she is unable to grip well.  On exam patient does have a weak grip. She describes loss of sensation in her thumb index and middle fingers on the left.  08:40 Dr Danielle Dess, will look at scan and call me back.  08:47 Dr Danielle Dess, start on steroids. Will see in ED as consult.  Mr Cervical Spine W Wo Contrast  02/02/2012  *RADIOLOGY REPORT*  Clinical Data: Severe neck pain and left arm weakness and numbness.  MRI CERVICAL SPINE WITHOUT AND WITH CONTRAST  Technique:  Multiplanar and multiecho pulse sequences of the cervical spine, to include the craniocervical junction and cervicothoracic junction, were obtained according to standard protocol without and with intravenous contrast.  Contrast: 20mL MULTIHANCE GADOBENATE DIMEGLUMINE 529 MG/ML IV SOLN  Comparison: Cervical spine radiographs 10/28/2011.  Findings: Examination is somewhat limited due to motion artifact.The sagittal MR images demonstrate normal alignment of the cervical vertebral bodies.  They demonstrate normal marrow signal. No abnormal increased STIR signal intensity in the posterior elements, paraspinal musculature or facets. The cervical spinal cord demonstrates normal signal intensity.  No Chiari malformation is seen.  C2-3:  No significant findings.  C3-4:  No significant findings.  C4-5:  No significant findings.  C5-6:  There is a large left paracentral and medial foraminal disc protrusion with significant mass effect on the left side of the thecal sac and on the left C6 nerve root.  C6-7:  No significant findings.  C7-T1:  No significant findings.  IMPRESSION: Large left paracentral and medial foraminal disc protrusion at C5- 6.  Original Report Authenticated By: P. Loralie Champagne, M.D.   Dg  Shoulder Left  01/31/2012  *RADIOLOGY REPORT*  Clinical Data: Shoulder pain  LEFT SHOULDER - 2+ VIEW  Comparison: None.  Findings: No acute fracture and no dislocation.  Unremarkable soft tissues  IMPRESSION: No acute bony pathology.  Original Report Authenticated By: Donavan Burnet, M.D.    Diagnoses that have been ruled out:  None  Diagnoses that are still under consideration:  None  Final diagnoses:  Intervertebral disc protrusion  Neck pain   Plan discharge  Devoria Albe, MD, Franz Dell, MD 02/02/12 [phone]

## 2012-02-02 NOTE — ED Provider Notes (Signed)
Care assumed from Schorr, NP and Dierdre Highman, MD in CDU. Patient on back pain protocol for neck pain x the past 7 days without known injury. She has been seen in the ED several times in the past week for this, and does have an upcoming appointment with Dr. Jeral Fruit on Monday. An MRI was ordered to further evaluate the cervical spine which shows large left paracentral/medial foraminal disc bulge with impingement on the C6 left nerve root. Patient has required a significant amount of analgesics while in the ED. Given this, will consult neurosurgery for recommendations. I have discussed with Dr. Devoria Albe who will place consult.  9:27 AM Dr. Lynelle Doctor has discussed with Dr. Danielle Dess. He will consult in ED.  10:07 AM Dr. Danielle Dess has seen and discussed options with patient. Will require surgery, likely later this week. Will discharge home with steroids. I've written prescriptions for pain medication to last pt until surgery. Pt agreeable. Return precautions discussed.  Results for orders placed during the hospital encounter of 02/01/12  CBC      Component Value Range   WBC 9.1  4.0 - 10.5 (K/uL)   RBC 4.10  3.87 - 5.11 (MIL/uL)   Hemoglobin 12.8  12.0 - 15.0 (g/dL)   HCT 47.8  29.5 - 62.1 (%)   MCV 90.7  78.0 - 100.0 (fL)   MCH 31.2  26.0 - 34.0 (pg)   MCHC 34.4  30.0 - 36.0 (g/dL)   RDW 30.8  65.7 - 84.6 (%)   Platelets 241  150 - 400 (K/uL)  DIFFERENTIAL      Component Value Range   Neutrophils Relative 60  43 - 77 (%)   Neutro Abs 5.5  1.7 - 7.7 (K/uL)   Lymphocytes Relative 34  12 - 46 (%)   Lymphs Abs 3.1  0.7 - 4.0 (K/uL)   Monocytes Relative 4  3 - 12 (%)   Monocytes Absolute 0.3  0.1 - 1.0 (K/uL)   Eosinophils Relative 2  0 - 5 (%)   Eosinophils Absolute 0.2  0.0 - 0.7 (K/uL)   Basophils Relative 0  0 - 1 (%)   Basophils Absolute 0.0  0.0 - 0.1 (K/uL)  BASIC METABOLIC PANEL      Component Value Range   Sodium 139  135 - 145 (mEq/L)   Potassium 3.8  3.5 - 5.1 (mEq/L)   Chloride 103  96 - 112  (mEq/L)   CO2 24  19 - 32 (mEq/L)   Glucose, Bld 86  70 - 99 (mg/dL)   BUN 7  6 - 23 (mg/dL)   Creatinine, Ser 9.62  0.50 - 1.10 (mg/dL)   Calcium 9.0  8.4 - 95.2 (mg/dL)   GFR calc non Af Amer >90  >90 (mL/min)   GFR calc Af Amer >90  >90 (mL/min)  URINALYSIS, ROUTINE W REFLEX MICROSCOPIC      Component Value Range   Color, Urine YELLOW  YELLOW    APPearance CLOUDY (*) CLEAR    Specific Gravity, Urine 1.014  1.005 - 1.030    pH 5.0  5.0 - 8.0    Glucose, UA NEGATIVE  NEGATIVE (mg/dL)   Hgb urine dipstick NEGATIVE  NEGATIVE    Bilirubin Urine NEGATIVE  NEGATIVE    Ketones, ur NEGATIVE  NEGATIVE (mg/dL)   Protein, ur NEGATIVE  NEGATIVE (mg/dL)   Urobilinogen, UA 0.2  0.0 - 1.0 (mg/dL)   Nitrite NEGATIVE  NEGATIVE    Leukocytes, UA NEGATIVE  NEGATIVE   POCT  PREGNANCY, URINE      Component Value Range   Preg Test, Ur NEGATIVE  NEGATIVE    Mr Cervical Spine W Wo Contrast  02/02/2012  *RADIOLOGY REPORT*  Clinical Data: Severe neck pain and left arm weakness and numbness.  MRI CERVICAL SPINE WITHOUT AND WITH CONTRAST  Technique:  Multiplanar and multiecho pulse sequences of the cervical spine, to include the craniocervical junction and cervicothoracic junction, were obtained according to standard protocol without and with intravenous contrast.  Contrast: 20mL MULTIHANCE GADOBENATE DIMEGLUMINE 529 MG/ML IV SOLN  Comparison: Cervical spine radiographs 10/28/2011.  Findings: Examination is somewhat limited due to motion artifact.The sagittal MR images demonstrate normal alignment of the cervical vertebral bodies.  They demonstrate normal marrow signal. No abnormal increased STIR signal intensity in the posterior elements, paraspinal musculature or facets. The cervical spinal cord demonstrates normal signal intensity.  No Chiari malformation is seen.  C2-3:  No significant findings.  C3-4:  No significant findings.  C4-5:  No significant findings.  C5-6:  There is a large left paracentral and medial  foraminal disc protrusion with significant mass effect on the left side of the thecal sac and on the left C6 nerve root.  C6-7:  No significant findings.  C7-T1:  No significant findings.  IMPRESSION: Large left paracentral and medial foraminal disc protrusion at C5- 6.  Original Report Authenticated By: P. Loralie Champagne, M.D.   Dg Shoulder Left  01/31/2012  *RADIOLOGY REPORT*  Clinical Data: Shoulder pain  LEFT SHOULDER - 2+ VIEW  Comparison: None.  Findings: No acute fracture and no dislocation.  Unremarkable soft tissues  IMPRESSION: No acute bony pathology.  Original Report Authenticated By: Donavan Burnet, M.D.      Grant Fontana, [place] 02/02/12 [address], [place] 02/02/12 1032
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
## 2012-02-02 NOTE — ED Provider Notes (Signed)
History     CSN: [id number]  Arrival date & time 02/01/12  1859   First MD Initiated Contact with Patient 02/01/12 2057      Chief Complaint  Patient presents with  . Neck Injury    HPI Patient is a 41 y.o. female presenting with neck injury. The history is provided by the patient.  Neck Injury This is a new problem. The current episode started in the past 7 days. The problem occurs constantly. The problem has been gradually worsening. Associated symptoms include neck pain, numbness and weakness. Pertinent negatives include no fever or headaches. The symptoms are aggravated by nothing. She has tried NSAIDs for the symptoms. The treatment provided no relief.  Neck Injury This is a new problem. The current episode started in the past 7 days. The problem occurs constantly. The problem has been gradually worsening. Pertinent negatives include no headaches. The symptoms are aggravated by nothing. She has tried NSAIDs for the symptoms. The treatment provided no relief.  Patient reports three-day history of persistent left lateral neck pain that radiates into the left shoulder and left arm. Patient states that she has had numbness and tingling to her left arm but today mostly the arm has felt numb. She states that she is unable to lift her left arm. Patient denies specific injury although states that she works at the call center for [employer] and does sits for prolonged periods of time with the phone between her left ear and left shoulder. Patient noted to be sobbing during interview. Patient seen in the [facility] emergency department  on 01/30/2012 and then again at [facility] on 01/31/2012 for same complaint. Patient denies chest pain, shortness of breath, fever or headaches. Denies other areas of focal weakness or neurological symptoms.  History reviewed. No pertinent past medical history.  Past Surgical History  Procedure Date  . Abdominal hysterectomy   . Appendectomy   . Liver surgery     History  reviewed. No pertinent family history.  History  Substance Use Topics  . Smoking status: Current Everyday Smoker  . Smokeless tobacco: Not on file  . Alcohol Use: No    OB History    Grav Para Term Preterm Abortions TAB SAB Ect Mult Living                  Review of Systems  Constitutional: Negative for fever.  HENT: Positive for neck pain.   Eyes: Negative.   Respiratory: Negative.   Cardiovascular: Negative.   Gastrointestinal: Negative.   Genitourinary: Negative.   Skin: Negative.   Neurological: Positive for weakness and numbness. Negative for headaches.  Hematological: Negative.   Psychiatric/Behavioral: Negative.     Allergies  Review of patient's allergies indicates no known allergies.  Home Medications   Current Outpatient Rx  Name Route Sig Dispense Refill  . IBUPROFEN 200 MG PO TABS Oral Take 400 mg by mouth every 6 (six) hours as needed. For pain       BP 114/75  Pulse 79  Temp(Src) 97.8 F (36.6 C) (Oral)  Resp 18  SpO2 96%  LMP 09/26/2010  Physical Exam  Constitutional: She is oriented to person, place, and time. She appears well-developed and well-nourished.  HENT:  Head: Normocephalic and atraumatic.  Eyes: Conjunctivae are normal.  Neck: Neck supple.  Cardiovascular: Normal rate and regular rhythm.   Pulmonary/Chest: Effort normal and breath sounds normal.  Abdominal: Soft. Bowel sounds are normal.  Musculoskeletal: Normal range of motion.  Back:       Arms: Neurological: She is alert and oriented to person, place, and time.  Skin: Skin is warm and dry. No erythema.  Psychiatric: She has a normal mood and affect.    ED Course  Procedures Patient has continued you to sob and reports no relief of pain with IM Dilaudid and Valium by mouth. Discussed patient with Dr. Golda Acre. Will place him to "[facility] Back Pain Protocol" and plan for MRI in the morning. Patient agreeable with plan. I have discussed this pt w/ radiologist who states the  appropriate test to r/o both disc  (and cord) abnormalities is an MRI Cervical spine w/ and w/o cm.   0300:  Pt has continued to request pain medicine every 1 to 1.5 hours. RN notes that pt seems to move (L) arm w/o difficulty.   0545:  Pt continued to c/o (L) lateral neck pain and weakness to (L) arm. Will add another dose of PO Valium and await MRI as planned.  Labs Reviewed  URINALYSIS, ROUTINE W REFLEX MICROSCOPIC - Abnormal; Notable for the following:    APPearance CLOUDY (*)    All other components within normal limits  CBC  DIFFERENTIAL  BASIC METABOLIC PANEL  POCT PREGNANCY, URINE   Dg Shoulder Left  01/31/2012  *RADIOLOGY REPORT*  Clinical Data: Shoulder pain  LEFT SHOULDER - 2+ VIEW  Comparison: None.  Findings: No acute fracture and no dislocation.  Unremarkable soft tissues  IMPRESSION: No acute bony pathology.  Original Report Authenticated By: Donavan Burnet, M.D.     No diagnosis found.    MDM  HPI/PE and clinical findings/course c/w  1. Cervical strain/radiculopathy (Pt reports significant weakness to LUE,  though has been noted by byself and other staff to move the LUE at intervals. Pt has been placed on the "[facility] Back Pain Protocol" for pain management and MRI cervical spine w/ and w/o cm in am. MRI, clinical impression and disposition pending.        Leanne Chang, NP 02/02/12 [phone]   Medical screening examination/treatment/procedure(s) were conducted as a shared visit with non-physician practitioner(s) and myself.  I personally evaluated the patient during the encounter. Left-sided neck and trapezial pain the last week causing weakness and numbness to left arm. No fevers or chills or recent trauma. On evaluation patient is very tearful, she has reproducible tenderness left paraspinal region and somewhat cervical midline. No sensory deficits with sensation to light touch intact throughout upper extremities. Grips equal and intact. Biceps equal and intact  with subjective weakness. Old records reviewed and patient has had 3 ED visits now for the same symptoms. Left shoulder x-ray reviewed is within normal limits. Case discussed with radiologist feels MRI would be most appropriate study, holding CT scan and MRI ordered. Patient placed on observation protocol for pain medications and serial evaluations.  Sunnie Nielsen, MD 02/02/12 [phone]

## 2012-02-02 NOTE — ED Provider Notes (Signed)
See prior note   Ward Givens, MD 02/02/12 1512

## 2012-02-02 NOTE — ED Notes (Signed)
MD at bedside.  Dr Danielle Dess at the patient bedside

## 2012-02-02 NOTE — ED Notes (Signed)
Assumed care of pt from [place], [place].  Pt updated on POC.  Given another pillow.  No distress noted.

## 2012-02-02 NOTE — ED Notes (Signed)
Given coke and crackers 

## 2012-02-04 ENCOUNTER — Other Ambulatory Visit (HOSPITAL_COMMUNITY): Payer: Self-pay | Admitting: *Deleted

## 2012-02-04 ENCOUNTER — Encounter (HOSPITAL_COMMUNITY): Payer: Self-pay | Admitting: Pharmacy Technician

## 2012-02-04 ENCOUNTER — Other Ambulatory Visit: Payer: Self-pay | Admitting: Neurological Surgery

## 2012-02-04 ENCOUNTER — Encounter (HOSPITAL_COMMUNITY): Payer: Self-pay | Admitting: *Deleted

## 2012-02-04 MED ORDER — CEFAZOLIN SODIUM-DEXTROSE 2-3 GM-% IV SOLR
2.0000 g | INTRAVENOUS | Status: AC
  Administered 2012-02-05: 2 g via INTRAVENOUS

## 2012-02-04 MED ORDER — DEXTROSE 5 % IV SOLN: 2.0000 g | INTRAVENOUS | Status: DC

## 2012-02-05 ENCOUNTER — Ambulatory Visit (HOSPITAL_COMMUNITY): Payer: 59

## 2012-02-05 ENCOUNTER — Encounter (HOSPITAL_COMMUNITY)
Admission: RE | Disposition: A | Discharge status: Home / Self Care | Payer: Self-pay | Source: Ambulatory Visit | Attending: Neurological Surgery

## 2012-02-05 ENCOUNTER — Ambulatory Visit (HOSPITAL_COMMUNITY)
Admission: RE | Admit: 2012-02-05 | Discharge: 2012-02-05 | Disposition: A | Discharge status: Home / Self Care | Payer: 59 | Source: Ambulatory Visit | Attending: Neurological Surgery | Admitting: Neurological Surgery

## 2012-02-05 ENCOUNTER — Ambulatory Visit (HOSPITAL_COMMUNITY): Payer: 59 | Admitting: Anesthesiology

## 2012-02-05 ENCOUNTER — Encounter (HOSPITAL_COMMUNITY): Payer: Self-pay | Admitting: Anesthesiology

## 2012-02-05 ENCOUNTER — Encounter (HOSPITAL_COMMUNITY): Payer: Self-pay | Admitting: *Deleted

## 2012-02-05 DIAGNOSIS — M5020 Other cervical disc displacement, unspecified cervical region: Secondary | ICD-10-CM | POA: Insufficient documentation

## 2012-02-05 DIAGNOSIS — F17200 Nicotine dependence, unspecified, uncomplicated: Secondary | ICD-10-CM | POA: Insufficient documentation

## 2012-02-05 DIAGNOSIS — M47812 Spondylosis without myelopathy or radiculopathy, cervical region: Secondary | ICD-10-CM | POA: Insufficient documentation

## 2012-02-05 HISTORY — PX: ANTERIOR CERVICAL DECOMP/DISCECTOMY FUSION: SHX1161

## 2012-02-05 HISTORY — DX: Ehlers-Danlos syndrome, unspecified: Q79.60

## 2012-02-05 LAB — SURGICAL PCR SCREEN
MRSA, PCR: NEGATIVE
Staphylococcus aureus: NEGATIVE

## 2012-02-05 SURGERY — ANTERIOR CERVICAL DECOMPRESSION/DISCECTOMY FUSION 1 LEVEL
Anesthesia: General | Site: Neck | Wound class: Clean

## 2012-02-05 MED ORDER — MENTHOL 3 MG MT LOZG: 1.0000 | LOZENGE | OROMUCOSAL | Status: DC | PRN

## 2012-02-05 MED ORDER — PREDNISONE (PAK) 10 MG PO TABS: 10.0000 mg | ORAL_TABLET | Freq: Every day | ORAL | Status: DC

## 2012-02-05 MED ORDER — PHENOL 1.4 % MT LIQD
1.0000 | OROMUCOSAL | Status: DC | PRN
  Administered 2012-02-05: 1 via OROMUCOSAL
  Filled 2012-02-05: qty 177

## 2012-02-05 MED ORDER — LACTATED RINGERS IV SOLN
INTRAVENOUS | Status: DC | PRN
  Administered 2012-02-05: 13:00:00 via INTRAVENOUS

## 2012-02-05 MED ORDER — LIDOCAINE-EPINEPHRINE 1 %-1:100000 IJ SOLN
INTRAMUSCULAR | Status: DC | PRN
  Administered 2012-02-05: 20 mL

## 2012-02-05 MED ORDER — MUPIROCIN 2 % EX OINT
TOPICAL_OINTMENT | Freq: Once | CUTANEOUS | Status: AC
  Administered 2012-02-05: 12:00:00 via NASAL

## 2012-02-05 MED ORDER — DIAZEPAM 5 MG PO TABS
ORAL_TABLET | ORAL | Status: AC
  Administered 2012-02-05: 5 mg
  Filled 2012-02-05: qty 1

## 2012-02-05 MED ORDER — NEOSTIGMINE METHYLSULFATE 1 MG/ML IJ SOLN
INTRAMUSCULAR | Status: DC | PRN
  Administered 2012-02-05: 4 mg via INTRAVENOUS

## 2012-02-05 MED ORDER — SODIUM CHLORIDE 0.9 % IV SOLN: 250.0000 mL | INTRAVENOUS | Status: DC

## 2012-02-05 MED ORDER — 0.9 % SODIUM CHLORIDE (POUR BTL) OPTIME
TOPICAL | Status: DC | PRN
  Administered 2012-02-05: 1000 mL

## 2012-02-05 MED ORDER — PREDNISONE 10 MG PO TABS: 10.0000 mg | ORAL_TABLET | Freq: Every day | ORAL | Status: DC

## 2012-02-05 MED ORDER — OXYCODONE-ACETAMINOPHEN 5-325 MG PO TABS
1.0000 | ORAL_TABLET | ORAL | Status: DC | PRN
  Administered 2012-02-05: 2 via ORAL
  Filled 2012-02-05: qty 2

## 2012-02-05 MED ORDER — OXYCODONE-ACETAMINOPHEN 5-325 MG PO TABS
ORAL_TABLET | ORAL | Status: AC
  Administered 2012-02-05: 2
  Filled 2012-02-05: qty 2

## 2012-02-05 MED ORDER — SUFENTANIL CITRATE 50 MCG/ML IV SOLN
INTRAVENOUS | Status: DC | PRN
  Administered 2012-02-05: 20 ug via INTRAVENOUS

## 2012-02-05 MED ORDER — LIDOCAINE HCL (CARDIAC) 20 MG/ML IV SOLN
INTRAVENOUS | Status: DC | PRN
  Administered 2012-02-05: 80 mg via INTRAVENOUS

## 2012-02-05 MED ORDER — EPHEDRINE SULFATE 50 MG/ML IJ SOLN
INTRAMUSCULAR | Status: DC | PRN
  Administered 2012-02-05: 10 mg via INTRAVENOUS

## 2012-02-05 MED ORDER — ACETAMINOPHEN 650 MG RE SUPP: 650.0000 mg | RECTAL | Status: DC | PRN

## 2012-02-05 MED ORDER — PREDNISONE 20 MG PO TABS
20.0000 mg | ORAL_TABLET | ORAL | Status: AC
  Administered 2012-02-05: 20 mg via ORAL
  Filled 2012-02-05: qty 1

## 2012-02-05 MED ORDER — CEFAZOLIN SODIUM-DEXTROSE 2-3 GM-% IV SOLR
INTRAVENOUS | Status: AC
  Filled 2012-02-05: qty 50

## 2012-02-05 MED ORDER — DIAZEPAM 5 MG PO TABS: 5.0000 mg | ORAL_TABLET | Freq: Four times a day (QID) | ORAL | Status: AC | PRN

## 2012-02-05 MED ORDER — THROMBIN 5000 UNITS EX SOLR
CUTANEOUS | Status: DC | PRN
  Administered 2012-02-05: 5000 [IU] via TOPICAL

## 2012-02-05 MED ORDER — MUPIROCIN 2 % EX OINT
TOPICAL_OINTMENT | CUTANEOUS | Status: AC
  Filled 2012-02-05: qty 22

## 2012-02-05 MED ORDER — ROCURONIUM BROMIDE 100 MG/10ML IV SOLN
INTRAVENOUS | Status: DC | PRN
  Administered 2012-02-05: 50 mg via INTRAVENOUS

## 2012-02-05 MED ORDER — SODIUM CHLORIDE 0.9 % IR SOLN
Status: DC | PRN
  Administered 2012-02-05: 14:00:00

## 2012-02-05 MED ORDER — SODIUM CHLORIDE 0.9 % IJ SOLN: 3.0000 mL | Freq: Two times a day (BID) | INTRAMUSCULAR | Status: DC

## 2012-02-05 MED ORDER — ONDANSETRON HCL 4 MG/2ML IJ SOLN
INTRAMUSCULAR | Status: DC | PRN
  Administered 2012-02-05: 4 mg via INTRAVENOUS

## 2012-02-05 MED ORDER — SODIUM CHLORIDE 0.9 % IV SOLN
INTRAVENOUS | Status: AC
  Filled 2012-02-05: qty 500

## 2012-02-05 MED ORDER — HYDROMORPHONE HCL PF 1 MG/ML IJ SOLN
INTRAMUSCULAR | Status: AC
  Filled 2012-02-05: qty 1

## 2012-02-05 MED ORDER — DIAZEPAM 5 MG PO TABS: 5.0000 mg | ORAL_TABLET | Freq: Four times a day (QID) | ORAL | Status: DC | PRN

## 2012-02-05 MED ORDER — HEMOSTATIC AGENTS (NO CHARGE) OPTIME
TOPICAL | Status: DC | PRN
  Administered 2012-02-05: 1 via TOPICAL

## 2012-02-05 MED ORDER — HYDROMORPHONE HCL PF 1 MG/ML IJ SOLN
0.2500 mg | INTRAMUSCULAR | Status: DC | PRN
  Administered 2012-02-05 (×2): 0.5 mg via INTRAVENOUS

## 2012-02-05 MED ORDER — ONDANSETRON HCL 4 MG/2ML IJ SOLN: 4.0000 mg | INTRAMUSCULAR | Status: DC | PRN

## 2012-02-05 MED ORDER — SODIUM CHLORIDE 0.9 % IJ SOLN: 3.0000 mL | INTRAMUSCULAR | Status: DC | PRN

## 2012-02-05 MED ORDER — MORPHINE SULFATE 2 MG/ML IJ SOLN
INTRAMUSCULAR | Status: AC
  Filled 2012-02-05: qty 1

## 2012-02-05 MED ORDER — LIDOCAINE HCL 4 % MT SOLN
OROMUCOSAL | Status: DC | PRN
  Administered 2012-02-05: 4 mL via TOPICAL

## 2012-02-05 MED ORDER — ONDANSETRON HCL 4 MG/2ML IJ SOLN: 4.0000 mg | Freq: Once | INTRAMUSCULAR | Status: DC | PRN

## 2012-02-05 MED ORDER — SENNA 8.6 MG PO TABS
1.0000 | ORAL_TABLET | Freq: Two times a day (BID) | ORAL | Status: DC
  Administered 2012-02-05: 8.6 mg via ORAL
  Filled 2012-02-05: qty 1

## 2012-02-05 MED ORDER — PROPOFOL 10 MG/ML IV EMUL
INTRAVENOUS | Status: DC | PRN
  Administered 2012-02-05: 200 mg via INTRAVENOUS

## 2012-02-05 MED ORDER — ALUM & MAG HYDROXIDE-SIMETH 200-200-20 MG/5ML PO SUSP: 30.0000 mL | Freq: Four times a day (QID) | ORAL | Status: DC | PRN

## 2012-02-05 MED ORDER — BUPIVACAINE HCL (PF) 0.25 % IJ SOLN
INTRAMUSCULAR | Status: DC | PRN
  Administered 2012-02-05: 20 mL

## 2012-02-05 MED ORDER — ACETAMINOPHEN 325 MG PO TABS: 650.0000 mg | ORAL_TABLET | ORAL | Status: DC | PRN

## 2012-02-05 MED ORDER — GLYCOPYRROLATE 0.2 MG/ML IJ SOLN
INTRAMUSCULAR | Status: DC | PRN
  Administered 2012-02-05: 0.6 mg via INTRAVENOUS

## 2012-02-05 MED ORDER — OXYCODONE-ACETAMINOPHEN 5-325 MG PO TABS: 1.0000 | ORAL_TABLET | ORAL | Status: AC | PRN

## 2012-02-05 MED ORDER — DEXAMETHASONE SODIUM PHOSPHATE 10 MG/ML IJ SOLN
INTRAMUSCULAR | Status: DC | PRN
  Administered 2012-02-05: 10 mg via INTRAVENOUS

## 2012-02-05 MED ORDER — PREDNISONE 20 MG PO TABS: 20.0000 mg | ORAL_TABLET | Freq: Every day | ORAL | Status: DC

## 2012-02-05 MED ORDER — BACITRACIN 50000 UNITS IM SOLR
INTRAMUSCULAR | Status: AC
  Filled 2012-02-05: qty 1

## 2012-02-05 SURGICAL SUPPLY — 63 items
BAG DECANTER FOR FLEXI CONT (MISCELLANEOUS) ×2 IMPLANT
BANDAGE GAUZE ELAST BULKY 4 IN (GAUZE/BANDAGES/DRESSINGS) IMPLANT
BIT DRILL 2.3 12 FIXED (INSTRUMENTS) ×1 IMPLANT
BIT DRILL NEURO 2X3.1 SFT TUCH (MISCELLANEOUS) ×1 IMPLANT
BONE CERVICAL 6MM LRG (Bone Implant) ×2 IMPLANT
BUR BARREL STRAIGHT FLUTE 4.0 (BURR) ×2 IMPLANT
CANISTER SUCTION 2500CC (MISCELLANEOUS) ×2 IMPLANT
CLOTH BEACON ORANGE TIMEOUT ST (SAFETY) ×2 IMPLANT
CONT SPEC 4OZ CLIKSEAL STRL BL (MISCELLANEOUS) ×4 IMPLANT
DECANTER SPIKE VIAL GLASS SM (MISCELLANEOUS) ×2 IMPLANT
DERMABOND ADVANCED (GAUZE/BANDAGES/DRESSINGS) ×1
DERMABOND ADVANCED .7 DNX12 (GAUZE/BANDAGES/DRESSINGS) ×1 IMPLANT
DRAPE LAPAROTOMY 100X72 PEDS (DRAPES) ×2 IMPLANT
DRAPE MICROSCOPE LEICA (MISCELLANEOUS) ×2 IMPLANT
DRAPE POUCH INSTRU U-SHP 10X18 (DRAPES) ×2 IMPLANT
DRESSING TELFA 8X3 (GAUZE/BANDAGES/DRESSINGS) ×2 IMPLANT
DRILL 12MM (INSTRUMENTS) ×2
DRILL NEURO 2X3.1 SOFT TOUCH (MISCELLANEOUS) ×2
DRSG OPSITE 4X5.5 SM (GAUZE/BANDAGES/DRESSINGS) ×2 IMPLANT
DURAPREP 6ML APPLICATOR 50/CS (WOUND CARE) ×2 IMPLANT
ELECT CAUTERY BLADE 6.4 (BLADE) ×2 IMPLANT
ELECT REM PT RETURN 9FT ADLT (ELECTROSURGICAL) ×2
ELECTRODE REM PT RTRN 9FT ADLT (ELECTROSURGICAL) ×1 IMPLANT
GAUZE SPONGE 4X4 16PLY XRAY LF (GAUZE/BANDAGES/DRESSINGS) IMPLANT
GLOVE BIO SURGEON STRL SZ7.5 (GLOVE) IMPLANT
GLOVE BIOGEL M 8.0 STRL (GLOVE) ×2 IMPLANT
GLOVE BIOGEL PI IND STRL 7.0 (GLOVE) ×2 IMPLANT
GLOVE BIOGEL PI IND STRL 7.5 (GLOVE) IMPLANT
GLOVE BIOGEL PI IND STRL 8.5 (GLOVE) ×1 IMPLANT
GLOVE BIOGEL PI INDICATOR 7.0 (GLOVE) ×2
GLOVE BIOGEL PI INDICATOR 7.5 (GLOVE)
GLOVE BIOGEL PI INDICATOR 8.5 (GLOVE) ×1
GLOVE ECLIPSE 8.5 STRL (GLOVE) ×2 IMPLANT
GLOVE EXAM NITRILE LRG STRL (GLOVE) IMPLANT
GLOVE EXAM NITRILE MD LF STRL (GLOVE) IMPLANT
GLOVE EXAM NITRILE XL STR (GLOVE) IMPLANT
GLOVE EXAM NITRILE XS STR PU (GLOVE) IMPLANT
GLOVE OPTIFIT SS 6.5 STRL BRWN (GLOVE) ×8 IMPLANT
GLOVE SURG SS PI 6.5 STRL IVOR (GLOVE) ×2 IMPLANT
GLOVE SURG SS PI 7.0 STRL IVOR (GLOVE) ×2 IMPLANT
GOWN BRE IMP SLV AUR LG STRL (GOWN DISPOSABLE) ×4 IMPLANT
GOWN BRE IMP SLV AUR XL STRL (GOWN DISPOSABLE) ×2 IMPLANT
GOWN STRL REIN 2XL LVL4 (GOWN DISPOSABLE) ×2 IMPLANT
HEAD HALTER (SOFTGOODS) ×2 IMPLANT
KIT BASIN OR (CUSTOM PROCEDURE TRAY) ×2 IMPLANT
KIT ROOM TURNOVER OR (KITS) ×2 IMPLANT
NEEDLE HYPO 22GX1.5 SAFETY (NEEDLE) ×2 IMPLANT
NEEDLE SPNL 22GX3.5 QUINCKE BK (NEEDLE) ×2 IMPLANT
NS IRRIG 1000ML POUR BTL (IV SOLUTION) ×2 IMPLANT
PACK LAMINECTOMY NEURO (CUSTOM PROCEDURE TRAY) ×2 IMPLANT
PAD ARMBOARD 7.5X6 YLW CONV (MISCELLANEOUS) ×6 IMPLANT
PENCIL BUTTON HOLSTER BLD 10FT (ELECTRODE) ×2 IMPLANT
PLATE TRESTLE LUXE 12 1LVL (Plate) ×2 IMPLANT
PUTTY BONE 1CC ×2 IMPLANT
RUBBERBAND STERILE (MISCELLANEOUS) IMPLANT
SCREW 12MM (Screw) ×8 IMPLANT
SPONGE INTESTINAL PEANUT (DISPOSABLE) ×2 IMPLANT
SPONGE SURGIFOAM ABS GEL SZ50 (HEMOSTASIS) ×2 IMPLANT
SUT VIC AB 3-0 SH 8-18 (SUTURE) ×4 IMPLANT
SYR 20ML ECCENTRIC (SYRINGE) ×2 IMPLANT
TOWEL OR 17X24 6PK STRL BLUE (TOWEL DISPOSABLE) ×2 IMPLANT
TOWEL OR 17X26 10 PK STRL BLUE (TOWEL DISPOSABLE) ×2 IMPLANT
WATER STERILE IRR 1000ML POUR (IV SOLUTION) ×2 IMPLANT

## 2012-02-05 NOTE — H&P (Signed)
02/05/2012 No change from note of 02/02/2012 Barnett Abu, MD Physician Signed Neurosurgery Consult Note 02/02/2012 10:01 AM  Related encounter: Admission (Discharged) from 02/01/2012 in [facility] EMERGENCY DEPARTMENT   Reason for Consult: Herniated Nucleus pulposus C5-C6 left  Referring Physician: Paizlie Klaus is an 41 y.o. female.  HPI: 41 year old right-handed white female with 2 months of low-grade neck pain left shoulder pain now with acute worsening. Patient notes this past week has cause severe increase in pain and weakness in the left upper extremity. This is her third visit to the emergency room. An MRI was performed today demonstrating a large extruded fragment of disc at C5-C6 on the left side. The patient after being examined and discussing the situation relates that she had an appointment to see Dr. Jeral Fruit on Monday. I advised that the patient will likely require surgical extirpation of the disc to be done via an anterior decompression and arthrodesis.  Past medical history is notable for the patient having had a liver laceration in 1996. She's had anterior cruciate ligament reconstruction hysterectomy and appendectomy in the past.  History reviewed. No pertinent past medical history.    Past Surgical History    Procedure  Date    .  Abdominal hysterectomy     .  Appendectomy     .  Liver surgery      History reviewed. No pertinent family history.  Social History: reports that she has been smoking. She does not have any smokeless tobacco history on file. She reports that she does not drink alcohol or use illicit drugs.  Allergies: No Known Allergies  Medications: I have reviewed the patient's current medications.    Results for orders placed during the hospital encounter of 02/01/12 (from the past 48 hour(s))    CBC Status: Normal     Collection Time     02/01/12 10:48 PM    Component  Value  Range  Comment     WBC  9.1  4.0 - 10.5 (K/uL)      RBC   4.10  3.87 - 5.11 (MIL/uL)      Hemoglobin  12.8  12.0 - 15.0 (g/dL)      HCT  30.8  65.7 - 46.0 (%)      MCV  90.7  78.0 - 100.0 (fL)      MCH  31.2  26.0 - 34.0 (pg)      MCHC  34.4  30.0 - 36.0 (g/dL)      RDW  84.6  96.2 - 15.5 (%)      Platelets  241  150 - 400 (K/uL)     DIFFERENTIAL Status: Normal     Collection Time     02/01/12 10:48 PM    Component  Value  Range  Comment     Neutrophils Relative  60  43 - 77 (%)      Neutro Abs  5.5  1.7 - 7.7 (K/uL)      Lymphocytes Relative  34  12 - 46 (%)      Lymphs Abs  3.1  0.7 - 4.0 (K/uL)      Monocytes Relative  4  3 - 12 (%)      Monocytes Absolute  0.3  0.1 - 1.0 (K/uL)      Eosinophils Relative  2  0 - 5 (%)      Eosinophils Absolute  0.2  0.0 - 0.7 (K/uL)      Basophils  Relative  0  0 - 1 (%)      Basophils Absolute  0.0  0.0 - 0.1 (K/uL)     BASIC METABOLIC PANEL Status: Normal     Collection Time     02/01/12 10:48 PM    Component  Value  Range  Comment     Sodium  139  135 - 145 (mEq/L)      Potassium  3.8  3.5 - 5.1 (mEq/L)      Chloride  103  96 - 112 (mEq/L)      CO2  24  19 - 32 (mEq/L)      Glucose, Bld  86  70 - 99 (mg/dL)      BUN  7  6 - 23 (mg/dL)      Creatinine, Ser  0.68  0.50 - 1.10 (mg/dL)      Calcium  9.0  8.4 - 10.5 (mg/dL)      GFR calc non Af Amer  >90  >90 (mL/min)      GFR calc Af Amer  >90  >90 (mL/min)     URINALYSIS, ROUTINE W REFLEX MICROSCOPIC Status: Abnormal     Collection Time     02/02/12 1:22 AM    Component  Value  Range  Comment     Color, Urine  YELLOW  YELLOW      APPearance  CLOUDY (*)  CLEAR      Specific Gravity, Urine  1.014  1.005 - 1.030      pH  5.0  5.0 - 8.0      Glucose, UA  NEGATIVE  NEGATIVE (mg/dL)      Hgb urine dipstick  NEGATIVE  NEGATIVE      Bilirubin Urine  NEGATIVE  NEGATIVE      Ketones, ur  NEGATIVE  NEGATIVE (mg/dL)      Protein, ur  NEGATIVE  NEGATIVE (mg/dL)      Urobilinogen, UA  0.2  0.0 - 1.0 (mg/dL)      Nitrite  NEGATIVE  NEGATIVE      Leukocytes,  UA  NEGATIVE  NEGATIVE  MICROSCOPIC NOT DONE ON URINES WITH NEGATIVE PROTEIN, BLOOD, LEUKOCYTES, NITRITE, OR GLUCOSE <1000 mg/dL.    POCT PREGNANCY, URINE Status: Normal     Collection Time     02/02/12 1:32 AM    Component  Value  Range  Comment     Preg Test, Ur  NEGATIVE  NEGATIVE      Mr Cervical Spine W Wo Contrast  02/02/2012 *RADIOLOGY REPORT* Clinical Data: Severe neck pain and left arm weakness and numbness. MRI CERVICAL SPINE WITHOUT AND WITH CONTRAST Technique: Multiplanar and multiecho pulse sequences of the cervical spine, to include the craniocervical junction and cervicothoracic junction, were obtained according to standard protocol without and with intravenous contrast. Contrast: 20mL MULTIHANCE GADOBENATE DIMEGLUMINE 529 MG/ML IV SOLN Comparison: Cervical spine radiographs 10/28/2011. Findings: Examination is somewhat limited due to motion artifact.The sagittal MR images demonstrate normal alignment of the cervical vertebral bodies. They demonstrate normal marrow signal. No abnormal increased STIR signal intensity in the posterior elements, paraspinal musculature or facets. The cervical spinal cord demonstrates normal signal intensity. No Chiari malformation is seen. C2-3: No significant findings. C3-4: No significant findings. C4-5: No significant findings. C5-6: There is a large left paracentral and medial foraminal disc protrusion with significant mass effect on the left side of the thecal sac and on the left C6 nerve root. C6-7: No significant findings. C7-T1: No significant  findings. IMPRESSION: Large left paracentral and medial foraminal disc protrusion at C5- 6. Original Report Authenticated By: P. Loralie Champagne, M.D.  Dg Shoulder Left  01/31/2012 *RADIOLOGY REPORT* Clinical Data: Shoulder pain LEFT SHOULDER - 2+ VIEW Comparison: None. Findings: No acute fracture and no dislocation. Unremarkable soft tissues IMPRESSION: No acute bony pathology. Original Report Authenticated By: Donavan Burnet, M.D.   Review of Systems  HENT: Positive for neck pain.  Eyes: Negative.  Respiratory: Negative.  Cardiovascular: Negative.  Gastrointestinal: Negative.  Skin: Negative.  Neurological: Positive for tingling, sensory change, focal weakness and weakness.  Endo/Heme/Allergies: Negative.   Blood pressure 112/70, pulse 70, temperature 98.6 F (37 C), temperature source Oral, resp. rate 18, last menstrual period 09/26/2010, SpO2 98.00%.  Physical Exam  Constitutional: She appears well-developed and well-nourished. She appears distressed.  Neck: Normal range of motion. Neck supple.  Cardiovascular: Normal rate, regular rhythm and normal heart sounds.  Respiratory: Effort normal and breath sounds normal.  GI: Soft. Bowel sounds are normal.  Musculoskeletal: She exhibits tenderness.  decreased strength in biceps on the left side 4/5 decreased left wrist extensor strength decreased grip strength of 4/5. Tender over left side of neck and shoulder  Neurological: She displays abnormal reflex. She exhibits normal muscle tone.  Skin: Skin is warm and dry. She is not diaphoretic.   Assessment/Plan:  Has been advised regarding the probable need for surgical intervention including an anterior cervical discectomy and arthrodesis. She will be started on high-dose Decadron and a prescription for Sterapred Dosepak and also given an injection of Toradol here in the emergency department. She'll be given a prescription for Percocet for outpatient pain control. Her surgery will be scheduled electively as an outpatient.  Iliyana Convey J  02/02/2012, 10:01 AM

## 2012-02-05 NOTE — Anesthesia Preprocedure Evaluation (Addendum)
Anesthesia Evaluation  Patient identified by MRN, date of birth, ID band Patient awake, Patient confused and Patient unresponsive    Reviewed: Allergy & Precautions, H&P , NPO status , Patient's Chart, lab work & pertinent test results  History of Anesthesia Complications (+) AWARENESS UNDER ANESTHESIA  Airway Mallampati: II TM Distance: <3 FB Neck ROM: Full    Dental  (+) Teeth Intact and Dental Advisory Given   Pulmonary Current Smoker,          Cardiovascular     Neuro/Psych    GI/Hepatic   Endo/Other    Renal/GU      Musculoskeletal   Abdominal   Peds  Hematology   Anesthesia Other Findings   Reproductive/Obstetrics                          Anesthesia Physical Anesthesia Plan  ASA: I  Anesthesia Plan: General   Post-op Pain Management:    Induction: Intravenous  Airway Management Planned: Oral ETT  Additional Equipment:   Intra-op Plan:   Post-operative Plan: Extubation in OR  Informed Consent: I have reviewed the patients History and Physical, chart, labs and discussed the procedure including the risks, benefits and alternatives for the proposed anesthesia with the patient or authorized representative who has indicated his/her understanding and acceptance.     Plan Discussed with: CRNA, Anesthesiologist and Surgeon  Anesthesia Plan Comments:         Anesthesia Quick Evaluation

## 2012-02-05 NOTE — Transfer of Care (Signed)
Immediate Anesthesia Transfer of Care Note  Patient: Sandra Atkinson  Procedure(s) Performed: Procedure(s) (LRB): ANTERIOR CERVICAL DECOMPRESSION/DISCECTOMY FUSION 1 LEVEL (N/A)  Patient Location: PACU  Anesthesia Type: General  Level of Consciousness: awake, alert , oriented and patient cooperative  Airway & Oxygen Therapy: Patient Spontanous Breathing and Patient connected to face mask oxygen  Post-op Assessment: Report given to PACU RN, Post -op Vital signs reviewed and stable, Patient moving all extremities and Patient moving all extremities X 4  Post vital signs: Reviewed and stable  Complications: No apparent anesthesia complications

## 2012-02-05 NOTE — Discharge Summary (Signed)
Physician Discharge Summary  Patient ID: Sandra Atkinson MRN: [id number] DOB/AGE: [birth year] 40 y.o.  Admit date: 02/05/2012 Discharge date: 02/05/2012  Admission Diagnoses: Herniated nucleus pulposus C5-C6 with left cervical radiculopathy  Discharge Diagnoses: Herniated nucleus pulposus C5-C6 with left cervical radiculopathy Active Problems:  * No active hospital problems. *    Discharged Condition: good  Hospital Course: Patient was admitted to undergo surgical decompression of C5-C6. She tolerated the surgery well anterior plate fixation with allograft arthrodesis was also performed. She is discharged home  Consults: None  Significant Diagnostic Studies: None  Treatments: surgery: Anterior cervical decompression C5-C6 arthrodesis with structural allograft anterior plate fixation Q6-V7  Discharge Exam: Blood pressure 131/82, pulse 91, temperature 97.9 F (36.6 C), temperature source Oral, resp. rate 18, height 5\' 8"  (1.727 m), weight 86.183 kg (190 lb), last menstrual period 09/26/2010, SpO2 98.00%. Odor function intact and deltoid bicep tricep grip and intrinsics incision is clean dry. Station and gait are normal  Disposition: 01-Home or Self Care  Discharge Orders    Future Orders Please Complete By Expires   Diet - low sodium heart healthy      Increase activity slowly      Discharge instructions      Comments:   Okay to shower. Do not apply salves or appointments to incision. No heavy lifting with the upper extremities greater than 15 pounds. May resume driving when not requiring pain medication and patient feels comfortable with doing so.   Call MD for:  temperature >100.4      Call MD for:  severe uncontrolled pain      Call MD for:  redness, tenderness, or signs of infection (pain, swelling, redness, odor or green/yellow discharge around incision site)        Medication List  As of 02/05/2012  9:12 PM   TAKE these medications         diazepam 5 MG tablet   Commonly known as: VALIUM   Take 1 tablet (5 mg total) by mouth every 6 (six) hours as needed (Muscle spasm).      ibuprofen 200 MG tablet   Commonly known as: ADVIL,MOTRIN   Take 400 mg by mouth every 6 (six) hours as needed. For pain      oxyCODONE-acetaminophen 5-325 MG per tablet   Commonly known as: PERCOCET   Take 1 tablet by mouth every 4 (four) hours as needed. For pain.      oxyCODONE-acetaminophen 5-325 MG per tablet   Commonly known as: PERCOCET   Take 1-2 tablets by mouth every 4 (four) hours as needed for pain.      predniSONE 10 MG tablet   Commonly known as: STERAPRED UNI-PAK   Take 10-60 mg by mouth daily. Take 6 tabs (60 mg) on Day One, 5 tabs (50mg ) on Day Two, 4 tabs (40mg ) on Day Three, 3 tabs (30mg ) on Day Four, 2 tabs (20mg ) on Day Five, 1 tab (10mg ) on Day Six           Follow-up Information    Follow up with Stefani Dama, MD. Schedule an appointment as soon as possible for a visit in 3 weeks. (Call Aram Beecham for appointment)    Contact information:   [address], [address] [place] [address] [phone]          Signed: Stefani Dama 02/05/2012, 9:12 PM

## 2012-02-05 NOTE — Anesthesia Postprocedure Evaluation (Signed)
  Anesthesia Post-op Note  Patient: Sandra Atkinson  Procedure(s) Performed: Procedure(s) (LRB): ANTERIOR CERVICAL DECOMPRESSION/DISCECTOMY FUSION 1 LEVEL (N/A)  Patient Location: PACU  Anesthesia Type: General  Level of Consciousness: awake, alert , oriented and patient cooperative  Airway and Oxygen Therapy: Patient Spontanous Breathing and Patient connected to nasal cannula oxygen  Post-op Pain: mild  Post-op Assessment: Post-op Vital signs reviewed, Patient's Cardiovascular Status Stable, Respiratory Function Stable, Patent Airway, No signs of Nausea or vomiting and Pain level controlled  Post-op Vital Signs: stable  Complications: No apparent anesthesia complications

## 2012-02-05 NOTE — Progress Notes (Signed)
Pt d/c'd home with husband. Discharge instructions, after care note, prescriptions and work note given and explained to pt and spouse. Both verbalized understanding. Incision site on neck well approximated with no swelling, drainage, redness and/or any other signs of infection noted in the area. Pt medicated for pain prior to leaving. No complication noted. Pt escorted out of this unit by her spouse and the nurse tech.  All questions answered.

## 2012-02-05 NOTE — Progress Notes (Signed)
Orthopedic Tech Progress Note Patient Details:  Sandra Atkinson 01/13/1971 [id number]  Other Ortho Devices Type of Ortho Device: Other (comment) (soft collar) Ortho Device Interventions: Application   Jennye Moccasin 02/05/2012, 7:17 PM

## 2012-02-05 NOTE — Progress Notes (Signed)
Subjective: Patient reports Comfortable some soreness in throat left arm feels well  Objective: Vital signs in last 24 hours: Temp:  [97.9 F (36.6 C)] 97.9 F (36.6 C) (03/12 1442) Pulse Rate:  [56-85] 60  (03/12 1536) Resp:  [9-23] 23  (03/12 1536) BP: (123-129)/(76-85) 128/76 mmHg (03/12 1536) SpO2:  [95 %-100 %] 99 % (03/12 1536) Weight:  [86.183 kg (190 lb)] 86.183 kg (190 lb) (03/11 1706)  Intake/Output from previous day:   Intake/Output this shift: Total I/O In: 1000 [I.V.:1000] Out: -   incision clean and dry no swelling motor function good in deltoid and bicep and grip on the left side rated at 4/5  Lab Results: No results found for this basename: WBC:2,HGB:2,HCT:2,PLT:2 in the last 72 hours BMET No results found for this basename: NA:2,K:2,CL:2,CO2:2,GLUCOSE:2,BUN:2,CREATININE:2,CALCIUM:2 in the last 72 hours  Studies/Results: No results found.  Assessment/Plan: Stable post  LOS: 0 days  Ambulate give diet mobilize as tolerated Discharge in a.m.   Taz Vanness J 02/05/2012, 3:49 PM

## 2012-02-05 NOTE — Op Note (Signed)
Preoperative diagnosis: Cervical spondylosis with radiculopathy and herniated nucleus pulposus at C5-C6 left cervical radiculopathy Post operative diagnosis: Cervical spondylosis with radiculopathy and herniated nucleus pulposus C5-C6 left cervical radiculopathy Procedure: Anterior cervical discectomy decompression of nerve roots and spinal canal C5 to arthrodesis with structural allograft, Alphatec plate fixation Z6-1 Surgeon: Barnett Abu M.D. Asst.: Hilda Lias M.D. Indications: The patient is a 41 year old individual who has had several weeks of severe left shoulder and arm pain she's had 3 separate visits to the emergency department and on her last visit this past Saturday the patient underwent an MRI which demonstrated a large extruded fragment of disc at C5-C6 on top of a modestly spondylitic vertebrae the patient was advised regarding the need for surgery and is now taken  to the operating room for anterior decompression at C5-C6.   Procedure: The patient was brought to the operating room placed on the table in supine position. After the smooth induction of general endotracheal anesthesia neck was placed in 5 pounds of halter traction and prepped with alcohol and DuraPrep. After sterile draping and appropriate timeout procedure a transverse incision was created in the left side of the neck and carried down to the platysma. The plane between the sternocleidomastoid and strap muscles dissected bluntly until the prevertebral space was reached. The first identifiable disc space was noted to be C6-C7 on a localizing radiograph. The dissection was then undertaken in the longus coli muscle to allow placement of a self-retaining Caspar type retractor.  The anterior longitudinal ligament was opened at C5-C6 and ventral osteophytes were removed with a Leksell rongeur and Kerrison punch. Interspace was cleared of significant quantity of the degenerated disc material in the region of the posterior  longitudinal ligament was reached. Dissection was carried out using a high-speed drill and 3-0 Karlin curettes. Uncinate processes were drilled down and removed and osteophytes from the inferior margin of the body of C5 were removed with a Kerrison 2 mm gold punch. After the central canal and lateral recesses were well decompressed the stasis was achieved with the bipolar cautery and some small pledgets of Gelfoam soaked in thrombin that were later irrigated away.  A 6 mm transgraft was then prepared by enlarging the central cavity and filling with demineralized bone matrix and placing into the interspace.   Next the retractor was removed and a 12 mm trestle plate was placed over the vertebral bodies and secured with 12 mm variable angle screws. A final localizing radiograph identified the position of the surgical construct. The stasis was achieved in the soft tissues and then the platysma was closed with 3-0 Vicryl in an interrupted fashion and 3-0 Vicryl was used in the subcuticular tissue. Blood loss was estimated at 50 cc.

## 2012-02-11 ENCOUNTER — Encounter (HOSPITAL_COMMUNITY): Payer: Self-pay | Admitting: Neurological Surgery

## 2012-07-25 ENCOUNTER — Emergency Department (HOSPITAL_COMMUNITY): Payer: 59

## 2012-07-25 ENCOUNTER — Encounter (HOSPITAL_COMMUNITY): Payer: Self-pay | Admitting: Emergency Medicine

## 2012-07-25 ENCOUNTER — Emergency Department (HOSPITAL_COMMUNITY)
Admission: EM | Admit: 2012-07-25 | Discharge: 2012-07-26 | Disposition: A | Discharge status: Home / Self Care | Payer: 59 | Attending: Emergency Medicine | Admitting: Emergency Medicine

## 2012-07-25 DIAGNOSIS — R0602 Shortness of breath: Secondary | ICD-10-CM | POA: Insufficient documentation

## 2012-07-25 DIAGNOSIS — R110 Nausea: Secondary | ICD-10-CM | POA: Insufficient documentation

## 2012-07-25 DIAGNOSIS — R42 Dizziness and giddiness: Secondary | ICD-10-CM | POA: Insufficient documentation

## 2012-07-25 DIAGNOSIS — F411 Generalized anxiety disorder: Secondary | ICD-10-CM | POA: Insufficient documentation

## 2012-07-25 DIAGNOSIS — R079 Chest pain, unspecified: Secondary | ICD-10-CM | POA: Insufficient documentation

## 2012-07-25 LAB — CBC WITH DIFFERENTIAL/PLATELET
Basophils Absolute: 0 10*3/uL (ref 0.0–0.1)
Eosinophils Absolute: 0 10*3/uL (ref 0.0–0.7)
Eosinophils Relative: 0 % (ref 0–5)
HCT: 38.8 % (ref 36.0–46.0)
Lymphs Abs: 2.2 10*3/uL (ref 0.7–4.0)
MCH: 31.1 pg (ref 26.0–34.0)
MCV: 89.4 fL (ref 78.0–100.0)
Monocytes Absolute: 0.6 10*3/uL (ref 0.1–1.0)
Platelets: 269 10*3/uL (ref 150–400)
RDW: 13.2 % (ref 11.5–15.5)

## 2012-07-25 LAB — COMPREHENSIVE METABOLIC PANEL
ALT: 14 U/L (ref 0–35)
Calcium: 9.7 mg/dL (ref 8.4–10.5)
Creatinine, Ser: 0.68 mg/dL (ref 0.50–1.10)
GFR calc Af Amer: >90 mL/min (ref >90)
GFR calc non Af Amer: >90 mL/min (ref >90)
Glucose, Bld: 95 mg/dL (ref 70–99)
Sodium: 139 mEq/L (ref 135–145)
Total Protein: 7.8 g/dL (ref 6.0–8.3)

## 2012-07-25 LAB — TROPONIN I: Troponin I: <0.3 ng/mL (ref <0.30)

## 2012-07-25 MED ORDER — IOHEXOL 350 MG/ML SOLN
100.0000 mL | Freq: Once | INTRAVENOUS | Status: AC | PRN
  Administered 2012-07-25: 100 mL via INTRAVENOUS

## 2012-07-25 MED ORDER — LORAZEPAM 2 MG/ML IJ SOLN
1.0000 mg | Freq: Once | INTRAMUSCULAR | Status: AC
  Administered 2012-07-25: 1 mg via INTRAVENOUS
  Filled 2012-07-25: qty 1

## 2012-07-25 MED ORDER — SODIUM CHLORIDE 0.9 % IV SOLN
INTRAVENOUS | Status: DC
  Administered 2012-07-25: 19:00:00 via INTRAVENOUS

## 2012-07-25 MED ORDER — ONDANSETRON HCL 4 MG/2ML IJ SOLN
INTRAMUSCULAR | Status: AC
  Administered 2012-07-25: 18:00:00
  Filled 2012-07-25: qty 2

## 2012-07-25 NOTE — ED Notes (Signed)
Patient states she was running errands in a car without A/C- patient states she felt like her heart was racing and became nauseous.  Patient states her chest discomfort continued once she cooled off.  EMS reports chest pain 7/10.  Patient has h/o murmurs but denies any other cardiac history.

## 2012-07-25 NOTE — ED Notes (Signed)
Bed:[facility]<BR> Expected date:<BR> Expected time:<BR> Means of arrival:<BR> Comments:<BR>

## 2012-07-25 NOTE — ED Provider Notes (Signed)
History     CSN: [id number]  Arrival date & time 07/25/12  1638   First MD Initiated Contact with Patient 07/25/12 1749      Chief Complaint  Patient presents with  . Chest Pain  . Nausea    (Consider location/radiation/quality/duration/timing/severity/associated sxs/prior treatment) Patient is a 41 y.o. female presenting with chest pain. The history is provided by the patient.  Chest Pain    patient here with constant chest pain associated with dizziness which began after sitting in a car without a condition for prolonged period of time. Does increase stress and anxiety. States that she has just lost her job. She's also getting married in 20 days. Denies any anginal type chest pain. Denies any leg pain or swelling. Pain has been pleuritic. No recent fever or cough. She is a smoker. History of same for years ago and according to the patient showed a negative cardiac stress test  Past Medical History  Diagnosis Date  . Fibrodysplasia elastica generalisata     Hysterectomy    Past Surgical History  Procedure Date  . Abdominal hysterectomy   . Appendectomy   . Liver surgery   . Anterior cervical decomp/discectomy fusion 02/05/2012    Procedure: ANTERIOR CERVICAL DECOMPRESSION/DISCECTOMY FUSION 1 LEVEL;  Surgeon: Barnett Abu, MD;  Location: [facility];  Service: Neurosurgery;  Laterality: N/A;  Cervical Five-Six Anterior cervical decompression/diskectomy, fusion    Family History  Problem Relation Age of Onset  . Anesthesia problems Neg Hx     History  Substance Use Topics  . Smoking status: Current Everyday Smoker -- 0.5 packs/day for 20 years  . Smokeless tobacco: Not on file  . Alcohol Use: No    OB History    Grav Para Term Preterm Abortions TAB SAB Ect Mult Living                  Review of Systems  Cardiovascular: Positive for chest pain.  All other systems reviewed and are negative.    Allergies  Review of patient's allergies indicates no known  allergies.  Home Medications   Current Outpatient Rx  Name Route Sig Dispense Refill  . GABAPENTIN 300 MG PO CAPS Oral Take 300 mg by mouth 3 (three) times daily.    Marland Kitchen HYDROCODONE-ACETAMINOPHEN 7.5-750 MG PO TABS Oral Take 1 tablet by mouth every 6 (six) hours as needed. PAIN      BP 152/88  Pulse 84  Temp 98 F (36.7 C) (Oral)  Resp 24  SpO2 100%  LMP 09/26/2010  Physical Exam  Nursing note and vitals reviewed. Constitutional: She is oriented to person, place, and time. She appears well-developed and well-nourished.  Non-toxic appearance. No distress.  HENT:  Head: Normocephalic and atraumatic.  Eyes: Conjunctivae, EOM and lids are normal. Pupils are equal, round, and reactive to light.  Neck: Normal range of motion. Neck supple. No tracheal deviation present. No mass present.  Cardiovascular: Normal rate, regular rhythm and normal heart sounds.  Exam reveals no gallop.   No murmur heard. Pulmonary/Chest: Effort normal and breath sounds normal. No stridor. No respiratory distress. She has no decreased breath sounds. She has no wheezes. She has no rhonchi. She has no rales.  Abdominal: Soft. Normal appearance and bowel sounds are normal. She exhibits no distension. There is no tenderness. There is no rebound and no CVA tenderness.  Musculoskeletal: Normal range of motion. She exhibits no edema and no tenderness.  Neurological: She is alert and oriented to person, place,  and time. She has normal strength. No cranial nerve deficit or sensory deficit. GCS eye subscore is 4. GCS verbal subscore is 5. GCS motor subscore is 6.  Skin: Skin is warm and dry. No abrasion and no rash noted.  Psychiatric: Her speech is normal and behavior is normal. Her mood appears anxious.    ED Course  Procedures (including critical care time)  Labs Reviewed - No data to display No results found.   No diagnosis found.    MDM   Date: 07/25/2012  Rate: 89  Rhythm: normal sinus rhythm  QRS Axis:  normal  Intervals: normal  ST/T Wave abnormalities: normal  Conduction Disutrbances:none  Narrative Interpretation:   Old EKG Reviewed: none available  12:05 AM Pt given ativan and feels better, suspect anxiety, doubt acs, ct neg for pe--stable for d/c        Toy Baker, MD 07/26/12 0006

## 2012-07-26 MED ORDER — ALPRAZOLAM 0.25 MG PO TABS: 0.2500 mg | ORAL_TABLET | Freq: Three times a day (TID) | ORAL | Status: AC | PRN

## 2012-07-26 MED ORDER — ONDANSETRON 8 MG PO TBDP: 8.0000 mg | ORAL_TABLET | Freq: Three times a day (TID) | ORAL | Status: AC | PRN

## 2012-07-26 MED ORDER — OXYCODONE-ACETAMINOPHEN 5-325 MG PO TABS
2.0000 | ORAL_TABLET | Freq: Once | ORAL | Status: AC
  Administered 2012-07-26: 2 via ORAL
  Filled 2012-07-26: qty 2

## 2012-09-01 ENCOUNTER — Encounter (HOSPITAL_COMMUNITY): Payer: Self-pay

## 2012-09-01 ENCOUNTER — Emergency Department (HOSPITAL_COMMUNITY): Payer: Medicaid Other

## 2012-09-01 ENCOUNTER — Emergency Department (HOSPITAL_COMMUNITY)
Admission: EM | Admit: 2012-09-01 | Discharge: 2012-09-01 | Disposition: A | Discharge status: Home / Self Care | Payer: Medicaid Other | Attending: Emergency Medicine | Admitting: Emergency Medicine

## 2012-09-01 DIAGNOSIS — R109 Unspecified abdominal pain: Secondary | ICD-10-CM | POA: Insufficient documentation

## 2012-09-01 DIAGNOSIS — M549 Dorsalgia, unspecified: Secondary | ICD-10-CM | POA: Insufficient documentation

## 2012-09-01 HISTORY — DX: Solitary pulmonary nodule: R91.1

## 2012-09-01 LAB — HEPATIC FUNCTION PANEL
ALT: 15 U/L (ref 0–35)
AST: 13 U/L (ref 0–37)
Albumin: 4.1 g/dL (ref 3.5–5.2)
Alkaline Phosphatase: 71 U/L (ref 39–117)
Total Protein: 7.5 g/dL (ref 6.0–8.3)

## 2012-09-01 LAB — BASIC METABOLIC PANEL
CO2: 23 mEq/L (ref 19–32)
Chloride: 103 mEq/L (ref 96–112)
Glucose, Bld: 78 mg/dL (ref 70–99)
Potassium: 3.3 mEq/L — ABNORMAL LOW (ref 3.5–5.1)
Sodium: 140 mEq/L (ref 135–145)

## 2012-09-01 LAB — CBC
Hemoglobin: 13.9 g/dL (ref 12.0–15.0)
MCH: 30.8 pg (ref 26.0–34.0)
RBC: 4.51 MIL/uL (ref 3.87–5.11)
WBC: 10.7 10*3/uL — ABNORMAL HIGH (ref 4.0–10.5)

## 2012-09-01 MED ORDER — MORPHINE SULFATE 4 MG/ML IJ SOLN
4.0000 mg | Freq: Once | INTRAMUSCULAR | Status: AC
  Administered 2012-09-01: 4 mg via INTRAVENOUS
  Filled 2012-09-01: qty 1

## 2012-09-01 MED ORDER — PROMETHAZINE HCL 25 MG PO TABS: 25.0000 mg | ORAL_TABLET | Freq: Three times a day (TID) | ORAL | Status: DC | PRN

## 2012-09-01 MED ORDER — TRAMADOL-ACETAMINOPHEN 37.5-325 MG PO TABS: ORAL_TABLET | ORAL | Status: DC

## 2012-09-01 MED ORDER — CYCLOBENZAPRINE HCL 10 MG PO TABS: 10.0000 mg | ORAL_TABLET | Freq: Three times a day (TID) | ORAL | Status: DC | PRN

## 2012-09-01 MED ORDER — ONDANSETRON HCL 4 MG/2ML IJ SOLN
4.0000 mg | Freq: Once | INTRAMUSCULAR | Status: AC
  Administered 2012-09-01: 4 mg via INTRAVENOUS
  Filled 2012-09-01: qty 2

## 2012-09-01 MED ORDER — KETOROLAC TROMETHAMINE 60 MG/2ML IM SOLN
60.0000 mg | Freq: Once | INTRAMUSCULAR | Status: AC
  Administered 2012-09-01: 60 mg via INTRAMUSCULAR
  Filled 2012-09-01: qty 2

## 2012-09-01 MED ORDER — CYCLOBENZAPRINE HCL 10 MG PO TABS
10.0000 mg | ORAL_TABLET | Freq: Three times a day (TID) | ORAL | Status: DC | PRN
  Administered 2012-09-01: 10 mg via ORAL
  Filled 2012-09-01: qty 1

## 2012-09-01 MED ORDER — IOHEXOL 300 MG/ML  SOLN
80.0000 mL | Freq: Once | INTRAMUSCULAR | Status: AC | PRN
  Administered 2012-09-01: 80 mL via INTRAVENOUS

## 2012-09-01 NOTE — Progress Notes (Signed)
Pt confirms she has not chosen an [employer] pcp

## 2012-09-01 NOTE — Progress Notes (Signed)
Pt states she is not sure why [employer] [facility] is not confirming per registration She states she received a letter from them in the last few weeks stating she still had active coverage referred pt back to [facility] toll free number and provided her a list of [place] self pay providers along with medication and financial assistance resources

## 2012-09-01 NOTE — ED Notes (Signed)
Patient reports that she has been having mid back pain that radiates to lower back around toward abdomen. Patient also states that she has upper abdominal pain that is worse when she coughs or breaths x 1 week. Patient also reports that she was in the ED in August and was told that she had a nodule of the lung.

## 2012-09-01 NOTE — ED Provider Notes (Signed)
History     CSN: [id number]  Arrival date & time 09/01/12  1011   First MD Initiated Contact with Patient 09/01/12 1145      Chief Complaint  Patient presents with  . Back Pain  . Abdominal Pain    (Consider location/radiation/quality/duration/timing/severity/associated sxs/prior treatment) HPI  Patient reports she has had back pain for several months. She states she has pain in her mid back and it radiates around underneath her rib cage in her abdomen. She states the pain is constant. She states it is sharp. She states it hurts worse when she breathes deep or moves certain ways.. She states it feels better if she keeps her knees flexed. She's had decreased appetite the last 3-4 days. She had nausea and vomiting yesterday. She states she had this before and was told she had a nodule in her lung. She denies numbness or tingling in her extremities. She indicates the sides of her abdomen are painful. Patient reports she had cervical spine surgery in March by Dr. Danielle Dess for a disc problem and has titanium plates.  PCP none  Past Medical History  Diagnosis Date  . Fibrodysplasia elastica generalisata     Hysterectomy  . Lung nodule     Past Surgical History  Procedure Date  . Abdominal hysterectomy   . Appendectomy   . Liver surgery   . Anterior cervical decomp/discectomy fusion 02/05/2012    Procedure: ANTERIOR CERVICAL DECOMPRESSION/DISCECTOMY FUSION 1 LEVEL;  Surgeon: Barnett Abu, MD;  Location: [facility];  Service: Neurosurgery;  Laterality: N/A;  Cervical Five-Six Anterior cervical decompression/diskectomy, fusion    Family History  Problem Relation Age of Onset  . Anesthesia problems Neg Hx   . Cancer Father     History  Substance Use Topics  . Smoking status: Current Every Day Smoker -- 0.5 packs/day for 20 years  . Smokeless tobacco: Never Used  . Alcohol Use: No  unemployed  OB History    Grav Para Term Preterm Abortions TAB SAB Ect Mult Living        Review of Systems  All other systems reviewed and are negative.    Allergies  Review of patient's allergies indicates no known allergies.  Home Medications   Current Outpatient Rx  Name Route Sig Dispense Refill  . GABAPENTIN 300 MG PO CAPS Oral Take 300 mg by mouth 3 (three) times daily.    Marland Kitchen HYDROCODONE-ACETAMINOPHEN 7.5-750 MG PO TABS Oral Take 1 tablet by mouth every 6 (six) hours as needed. For pain    . IBUPROFEN 200 MG PO TABS Oral Take 200 mg by mouth every 6 (six) hours as needed. For pain      BP 123/97  Pulse 86  Temp 98.8 F (37.1 C) (Oral)  Resp 20  SpO2 98%  LMP 09/26/2010  Vital signs normal    Physical Exam  Nursing note and vitals reviewed. Constitutional: She is oriented to person, place, and time. She appears well-developed and well-nourished.  Non-toxic appearance. She does not appear ill. No distress.  HENT:  Head: Normocephalic and atraumatic.  Right Ear: External ear normal.  Left Ear: External ear normal.  Nose: Nose normal. No mucosal edema or rhinorrhea.  Mouth/Throat: Oropharynx is clear and moist and mucous membranes are normal. No dental abscesses or uvula swelling.  Eyes: Conjunctivae normal and EOM are normal. Pupils are equal, round, and reactive to light.  Neck: Normal range of motion and full passive range of motion without pain. Neck supple.  Cardiovascular:  Normal rate, regular rhythm and normal heart sounds.  Exam reveals no gallop and no friction rub.   No murmur heard. Pulmonary/Chest: Effort normal and breath sounds normal. No respiratory distress. She has no wheezes. She has no rhonchi. She has no rales. She exhibits no tenderness and no crepitus.  Abdominal: Soft. Normal appearance and bowel sounds are normal. She exhibits no distension. There is tenderness. There is no rebound and no guarding.       Mild diffuse upper abdominal discomfort without localization or guarding  Musculoskeletal: Normal range of motion. She  exhibits no edema and no tenderness.       Moves all extremities well.   Neurological: She is alert and oriented to person, place, and time. She has normal strength. No cranial nerve deficit.  Skin: Skin is warm, dry and intact. No rash noted. No erythema. No pallor.  Psychiatric: She has a normal mood and affect. Her speech is normal and behavior is normal. Her mood appears not anxious.    ED Course  Procedures (including critical care time)   Medications  ibuprofen (ADVIL,MOTRIN) 200 MG tablet (not administered)  cyclobenzaprine (FLEXERIL) tablet 10 mg (10 mg Oral Given 09/01/12 1242)  ketorolac (TORADOL) injection 60 mg (60 mg Intramuscular Given 09/01/12 1242)  morphine 4 MG/ML injection 4 mg (4 mg Intravenous Given 09/01/12 1455)  ondansetron (ZOFRAN) injection 4 mg (4 mg Intravenous Given 09/01/12 1455)   14:30 pt states her pain is better. Have discussed her lab results and she is agreeable to getting a CT scan  Reviewed the [place] controlled substance site shows patient gets #60 hydrocodone/acetaminophen 5/500 every 20 days from Dr. Danielle Dess, the last prescription was filled on September 21.   Results for orders placed during the hospital encounter of 09/01/12  CBC      Component Value Range   WBC 10.7 (*) 4.0 - 10.5 K/uL   RBC 4.51  3.87 - 5.11 MIL/uL   Hemoglobin 13.9  12.0 - 15.0 g/dL   HCT 19.1  47.8 - 29.5 %   MCV 91.4  78.0 - 100.0 fL   MCH 30.8  26.0 - 34.0 pg   MCHC 33.7  30.0 - 36.0 g/dL   RDW 62.1  30.8 - 65.7 %   Platelets 268  150 - 400 K/uL  BASIC METABOLIC PANEL      Component Value Range   Sodium 140  135 - 145 mEq/L   Potassium 3.3 (*) 3.5 - 5.1 mEq/L   Chloride 103  96 - 112 mEq/L   CO2 23  19 - 32 mEq/L   Glucose, Bld 78  70 - 99 mg/dL   BUN 7  6 - 23 mg/dL   Creatinine, Ser 8.46  0.50 - 1.10 mg/dL   Calcium 9.2  8.4 - 96.2 mg/dL   GFR calc non Af Amer >90  >90 mL/min   GFR calc Af Amer >90  >90 mL/min  D-DIMER, QUANTITATIVE      Component  Value Range   D-Dimer, Quant 0.99 (*) 0.00 - 0.48 ug/mL-FEU  HEPATIC FUNCTION PANEL      Component Value Range   Total Protein 7.5  6.0 - 8.3 g/dL   Albumin 4.1  3.5 - 5.2 g/dL   AST 13  0 - 37 U/L   ALT 15  0 - 35 U/L   Alkaline Phosphatase 71  39 - 117 U/L   Total Bilirubin 0.2 (*) 0.3 - 1.2 mg/dL   Bilirubin, Direct <9.5  0.0 - 0.3 mg/dL   Indirect Bilirubin NOT CALCULATED  0.3 - 0.9 mg/dL  LIPASE, BLOOD      Component Value Range   Lipase 149 (*) 11 - 59 U/L   Laboratory interpretation all normal except    Dg Chest Port 1 View  09/01/2012  *RADIOLOGY REPORT*  Clinical Data: Chest pain  PORTABLE CHEST - 1 VIEW  Comparison: 07/25/2012  Findings: Mild cardiomegaly.  Normal vascularity.  Clear lungs.  No pneumothorax.  No pleural fluid.  IMPRESSION: Cardiomegaly without decompensation.   Original Report Authenticated By: Donavan Burnet, M.D.      1. Abdominal  pain, other specified site   2. Back pain     New Prescriptions   CYCLOBENZAPRINE (FLEXERIL) 10 MG TABLET    Take 1 tablet (10 mg total) by mouth 3 (three) times daily as needed for muscle spasms.   PROMETHAZINE (PHENERGAN) 25 MG TABLET    Take 1 tablet (25 mg total) by mouth every 8 (eight) hours as needed for nausea.   TRAMADOL-ACETAMINOPHEN (ULTRACET) 37.5-325 MG PER TABLET    2 tabs po QID prn pain   Plan discharge   Devoria Albe, MD, Armando Gang   MDM          Ward Givens, MD 09/01/12 [phone]

## 2012-11-20 ENCOUNTER — Other Ambulatory Visit: Payer: Self-pay | Admitting: Neurological Surgery

## 2012-11-20 ENCOUNTER — Ambulatory Visit
Admission: RE | Admit: 2012-11-20 | Discharge: 2012-11-20 | Disposition: A | Discharge status: Home / Self Care | Payer: Medicaid Other | Source: Ambulatory Visit | Attending: Neurological Surgery | Admitting: Neurological Surgery

## 2012-11-20 DIAGNOSIS — M542 Cervicalgia: Secondary | ICD-10-CM

## 2013-08-25 ENCOUNTER — Emergency Department (HOSPITAL_COMMUNITY)
Admission: EM | Admit: 2013-08-25 | Discharge: 2013-08-25 | Disposition: A | Discharge status: Home / Self Care | Payer: Medicaid Other | Attending: Emergency Medicine | Admitting: Emergency Medicine

## 2013-08-25 ENCOUNTER — Emergency Department (HOSPITAL_COMMUNITY): Payer: Medicaid Other

## 2013-08-25 ENCOUNTER — Encounter (HOSPITAL_COMMUNITY): Payer: Self-pay | Admitting: Adult Health

## 2013-08-25 DIAGNOSIS — Z87768 Personal history of other specified (corrected) congenital malformations of integument, limbs and musculoskeletal system: Secondary | ICD-10-CM | POA: Insufficient documentation

## 2013-08-25 DIAGNOSIS — W010XXA Fall on same level from slipping, tripping and stumbling without subsequent striking against object, initial encounter: Secondary | ICD-10-CM | POA: Insufficient documentation

## 2013-08-25 DIAGNOSIS — Z8776 Personal history of (corrected) congenital malformations of integument, limbs and musculoskeletal system: Secondary | ICD-10-CM | POA: Insufficient documentation

## 2013-08-25 DIAGNOSIS — G8929 Other chronic pain: Secondary | ICD-10-CM | POA: Insufficient documentation

## 2013-08-25 DIAGNOSIS — T07XXXA Unspecified multiple injuries, initial encounter: Secondary | ICD-10-CM | POA: Insufficient documentation

## 2013-08-25 DIAGNOSIS — X500XXA Overexertion from strenuous movement or load, initial encounter: Secondary | ICD-10-CM | POA: Insufficient documentation

## 2013-08-25 DIAGNOSIS — Y939 Activity, unspecified: Secondary | ICD-10-CM | POA: Insufficient documentation

## 2013-08-25 DIAGNOSIS — F17200 Nicotine dependence, unspecified, uncomplicated: Secondary | ICD-10-CM | POA: Insufficient documentation

## 2013-08-25 DIAGNOSIS — S8990XA Unspecified injury of unspecified lower leg, initial encounter: Secondary | ICD-10-CM | POA: Insufficient documentation

## 2013-08-25 DIAGNOSIS — S0993XA Unspecified injury of face, initial encounter: Secondary | ICD-10-CM | POA: Insufficient documentation

## 2013-08-25 DIAGNOSIS — Y92009 Unspecified place in unspecified non-institutional (private) residence as the place of occurrence of the external cause: Secondary | ICD-10-CM | POA: Insufficient documentation

## 2013-08-25 DIAGNOSIS — Z79899 Other long term (current) drug therapy: Secondary | ICD-10-CM | POA: Insufficient documentation

## 2013-08-25 MED ORDER — ONDANSETRON 4 MG PO TBDP
8.0000 mg | ORAL_TABLET | Freq: Once | ORAL | Status: AC
  Administered 2013-08-25: 8 mg via ORAL
  Filled 2013-08-25: qty 2

## 2013-08-25 MED ORDER — HYDROMORPHONE HCL PF 1 MG/ML IJ SOLN
1.0000 mg | Freq: Once | INTRAMUSCULAR | Status: AC
  Administered 2013-08-25: 1 mg via INTRAMUSCULAR
  Filled 2013-08-25: qty 1

## 2013-08-25 MED ORDER — HYDROMORPHONE HCL PF 1 MG/ML IJ SOLN
2.0000 mg | Freq: Once | INTRAMUSCULAR | Status: AC
  Administered 2013-08-25: 2 mg via INTRAMUSCULAR
  Filled 2013-08-25: qty 2

## 2013-08-25 NOTE — ED Notes (Signed)
Pt states her pain was manageable after pain med administration, but after moving around in radiology pt reports her pain is back at 10/10. Md notified.

## 2013-08-25 NOTE — ED Notes (Signed)
Pt has ride home.

## 2013-08-25 NOTE — ED Notes (Signed)
Presents post fall on Friday last week, reports slipping on deck and hitting head, back and right leg twisted under her. C/o full body pain, worse, left ankle and headache. Uses pain management clinic for back and neck pain. CMS intact. Left ankle bruising. Pain has become worse over the past 4 days.  Reports "i think i might of have lost consciousness for about 30 seconds"

## 2013-08-25 NOTE — ED Provider Notes (Signed)
CSN: [id number]     Arrival date & time 08/25/13  1638 History   First MD Initiated Contact with Patient 08/25/13 1937     Chief Complaint  Patient presents with  . Fall   (Consider location/radiation/quality/duration/timing/severity/associated sxs/prior Treatment) Patient is a 42 y.o. female presenting with fall. The history is provided by the patient.  Fall   patient here complaining of head, neck, bilateral ankle, right knee pain after a fall at home 3 days ago. Possible loss of consciousness. Patient has been able to ambulate. Pain characterized as sharp and worse with movement. Patient feels like she twisted her right knee and both ankles. Patient is in pain management. Has used her normal medications without relief. Denies any chest or abdominal discomfort. No visual changes. No vomiting appreciated. Does have a history of chronic neck and back pain. No paresthesias in the upper lower or lower extremities  Past Medical History  Diagnosis Date  . Fibrodysplasia elastica generalisata     Hysterectomy  . Lung nodule    Past Surgical History  Procedure Laterality Date  . Abdominal hysterectomy    . Appendectomy    . Liver surgery    . Anterior cervical decomp/discectomy fusion  02/05/2012    Procedure: ANTERIOR CERVICAL DECOMPRESSION/DISCECTOMY FUSION 1 LEVEL;  Surgeon: Barnett Abu, MD;  Location: [facility];  Service: Neurosurgery;  Laterality: N/A;  Cervical Five-Six Anterior cervical decompression/diskectomy, fusion   Family History  Problem Relation Age of Onset  . Anesthesia problems Neg Hx   . Cancer Father    History  Substance Use Topics  . Smoking status: Current Every Day Smoker -- 0.50 packs/day for 20 years  . Smokeless tobacco: Never Used  . Alcohol Use: No   OB History   Grav Para Term Preterm Abortions TAB SAB Ect Mult Living                 Review of Systems  All other systems reviewed and are negative.    Allergies  Review of patient's allergies  indicates no known allergies.  Home Medications   Current Outpatient Rx  Name  Route  Sig  Dispense  Refill  . cyclobenzaprine (FLEXERIL) 10 MG tablet   Oral   Take 1 tablet (10 mg total) by mouth 3 (three) times daily as needed for muscle spasms.   30 tablet   0   . gabapentin (NEURONTIN) 300 MG capsule   Oral   Take 300 mg by mouth 3 (three) times daily.         Marland Kitchen HYDROcodone-acetaminophen (NORCO/VICODIN) 5-325 MG per tablet   Oral   Take 1 tablet by mouth every 6 (six) hours as needed.         Marland Kitchen HYDROcodone-acetaminophen (VICODIN ES) 7.5-750 MG per tablet   Oral   Take 1 tablet by mouth every 6 (six) hours as needed. For pain         . ibuprofen (ADVIL,MOTRIN) 200 MG tablet   Oral   Take 200 mg by mouth every 6 (six) hours as needed. For pain         . promethazine (PHENERGAN) 25 MG tablet   Oral   Take 1 tablet (25 mg total) by mouth every 8 (eight) hours as needed for nausea.   15 tablet   0   . traMADol-acetaminophen (ULTRACET) 37.5-325 MG per tablet      2 tabs po QID prn pain   16 tablet   0    BP  149/82  Pulse 80  Temp(Src) 98.9 F (37.2 C) (Oral)  Wt 153 lb (69.4 kg)  BMI 23.27 kg/m2  SpO2 100%  LMP 09/26/2010 Physical Exam  Nursing note and vitals reviewed. Constitutional: She is oriented to person, place, and time. She appears well-developed and well-nourished.  Non-toxic appearance. No distress.  HENT:  Head: Normocephalic and atraumatic.  Eyes: Conjunctivae, EOM and lids are normal. Pupils are equal, round, and reactive to light.  Neck: Normal range of motion. Neck supple. Spinous process tenderness and muscular tenderness present. No tracheal deviation and normal range of motion present. No mass present.    Cardiovascular: Normal rate, regular rhythm and normal heart sounds.  Exam reveals no gallop.   No murmur heard. Pulmonary/Chest: Effort normal and breath sounds normal. No stridor. No respiratory distress. She has no decreased  breath sounds. She has no wheezes. She has no rhonchi. She has no rales.  Abdominal: Soft. Normal appearance and bowel sounds are normal. She exhibits no distension. There is no tenderness. There is no rebound and no CVA tenderness.  Musculoskeletal: Normal range of motion. She exhibits no edema and no tenderness.       Feet:  Full range of motion at the knee without patellar effusion. Some pain with movement of the medial collateral ligament.  Neurological: She is alert and oriented to person, place, and time. She has normal strength. No cranial nerve deficit or sensory deficit. GCS eye subscore is 4. GCS verbal subscore is 5. GCS motor subscore is 6.  Skin: Skin is warm and dry. No abrasion and no rash noted.  Psychiatric: She has a normal mood and affect. Her speech is normal and behavior is normal.    ED Course  Procedures (including critical care time) Labs Review Labs Reviewed - No data to display Imaging Review Ct Head Wo Contrast  08/25/2013   CLINICAL DATA:  Fall with head injury.  EXAM: CT HEAD WITHOUT CONTRAST  CT CERVICAL SPINE WITHOUT CONTRAST  TECHNIQUE: Multidetector CT imaging of the head and cervical spine was performed following the standard protocol without intravenous contrast. Multiplanar CT image reconstructions of the cervical spine were also generated.  COMPARISON:  Cervical spine MRI dated 11/20/2012.  FINDINGS: CT HEAD FINDINGS  The brain demonstrates no evidence of hemorrhage, infarction, edema, mass effect, extra-axial fluid collection, hydrocephalus or mass lesion. The skull is unremarkable.  CT CERVICAL SPINE FINDINGS  There is evidence of prior cervical fusion at the C5-6 level. Anterior cervical fusion plate and vertebral screws appear appropriately positioned. Lucency at the level of the C5-6 disc space suggests lack of complete bony fusion at this level. The cervical spine shows normal alignment without fracture or subluxation. No soft tissue swelling is identified.  The visualized airway is normally patent.  IMPRESSION: CT HEAD IMPRESSION  Normal head CT.  CT CERVICAL SPINE IMPRESSION  No acute cervical injury identified. Prior anterior cervical fusion at the C5-6 level.   Electronically Signed   By: Irish Lack   On: 08/25/2013 19:40   Ct Cervical Spine Wo Contrast  08/25/2013   CLINICAL DATA:  Fall with head injury.  EXAM: CT HEAD WITHOUT CONTRAST  CT CERVICAL SPINE WITHOUT CONTRAST  TECHNIQUE: Multidetector CT imaging of the head and cervical spine was performed following the standard protocol without intravenous contrast. Multiplanar CT image reconstructions of the cervical spine were also generated.  COMPARISON:  Cervical spine MRI dated 11/20/2012.  FINDINGS: CT HEAD FINDINGS  The brain demonstrates no evidence of hemorrhage,  infarction, edema, mass effect, extra-axial fluid collection, hydrocephalus or mass lesion. The skull is unremarkable.  CT CERVICAL SPINE FINDINGS  There is evidence of prior cervical fusion at the C5-6 level. Anterior cervical fusion plate and vertebral screws appear appropriately positioned. Lucency at the level of the C5-6 disc space suggests lack of complete bony fusion at this level. The cervical spine shows normal alignment without fracture or subluxation. No soft tissue swelling is identified. The visualized airway is normally patent.  IMPRESSION: CT HEAD IMPRESSION  Normal head CT.  CT CERVICAL SPINE IMPRESSION  No acute cervical injury identified. Prior anterior cervical fusion at the C5-6 level.   Electronically Signed   By: Irish Lack   On: 08/25/2013 19:40    MDM  No diagnosis found. Patient given pain meds for her injuries which after x-rays were performed that showed any acute fractures. She is stable for discharge    Toy Baker, MD 08/25/13 2132

## 2013-08-25 NOTE — ED Notes (Signed)
No answer x2 

## 2013-12-11 ENCOUNTER — Encounter (HOSPITAL_COMMUNITY): Payer: Self-pay | Admitting: Emergency Medicine

## 2013-12-11 ENCOUNTER — Emergency Department (HOSPITAL_COMMUNITY)
Admission: EM | Admit: 2013-12-11 | Discharge: 2013-12-12 | Discharge status: Against Medical Advice | Payer: Medicaid Other | Attending: Emergency Medicine | Admitting: Emergency Medicine

## 2013-12-11 DIAGNOSIS — M542 Cervicalgia: Secondary | ICD-10-CM | POA: Insufficient documentation

## 2013-12-11 DIAGNOSIS — G8929 Other chronic pain: Secondary | ICD-10-CM | POA: Insufficient documentation

## 2013-12-11 DIAGNOSIS — M549 Dorsalgia, unspecified: Secondary | ICD-10-CM | POA: Insufficient documentation

## 2013-12-11 DIAGNOSIS — F17200 Nicotine dependence, unspecified, uncomplicated: Secondary | ICD-10-CM | POA: Insufficient documentation

## 2013-12-11 NOTE — ED Notes (Signed)
The pt has chronic back pain  And for the past 4 days her head has been hurting and she has rt arm numbness also.  She has had past  Neck and back surgery

## 2013-12-11 NOTE — ED Notes (Signed)
The p[t is out of her opana and goes to a  Pain clinic

## 2013-12-12 NOTE — ED Notes (Signed)
No answer x2 

## 2013-12-12 NOTE — ED Notes (Signed)
No answer in waiting room 

## 2013-12-12 NOTE — ED Notes (Signed)
No answer x3

## 2014-07-08 IMAGING — CR DG CHEST 2V
2 series · 2 of 2 positions shown · non-contrast
Comparison: 11/09/2009

CLINICAL DATA: Chest pain, dizziness, shortness of breath, smoker

CHEST - 2 VIEW

[w chest lat]
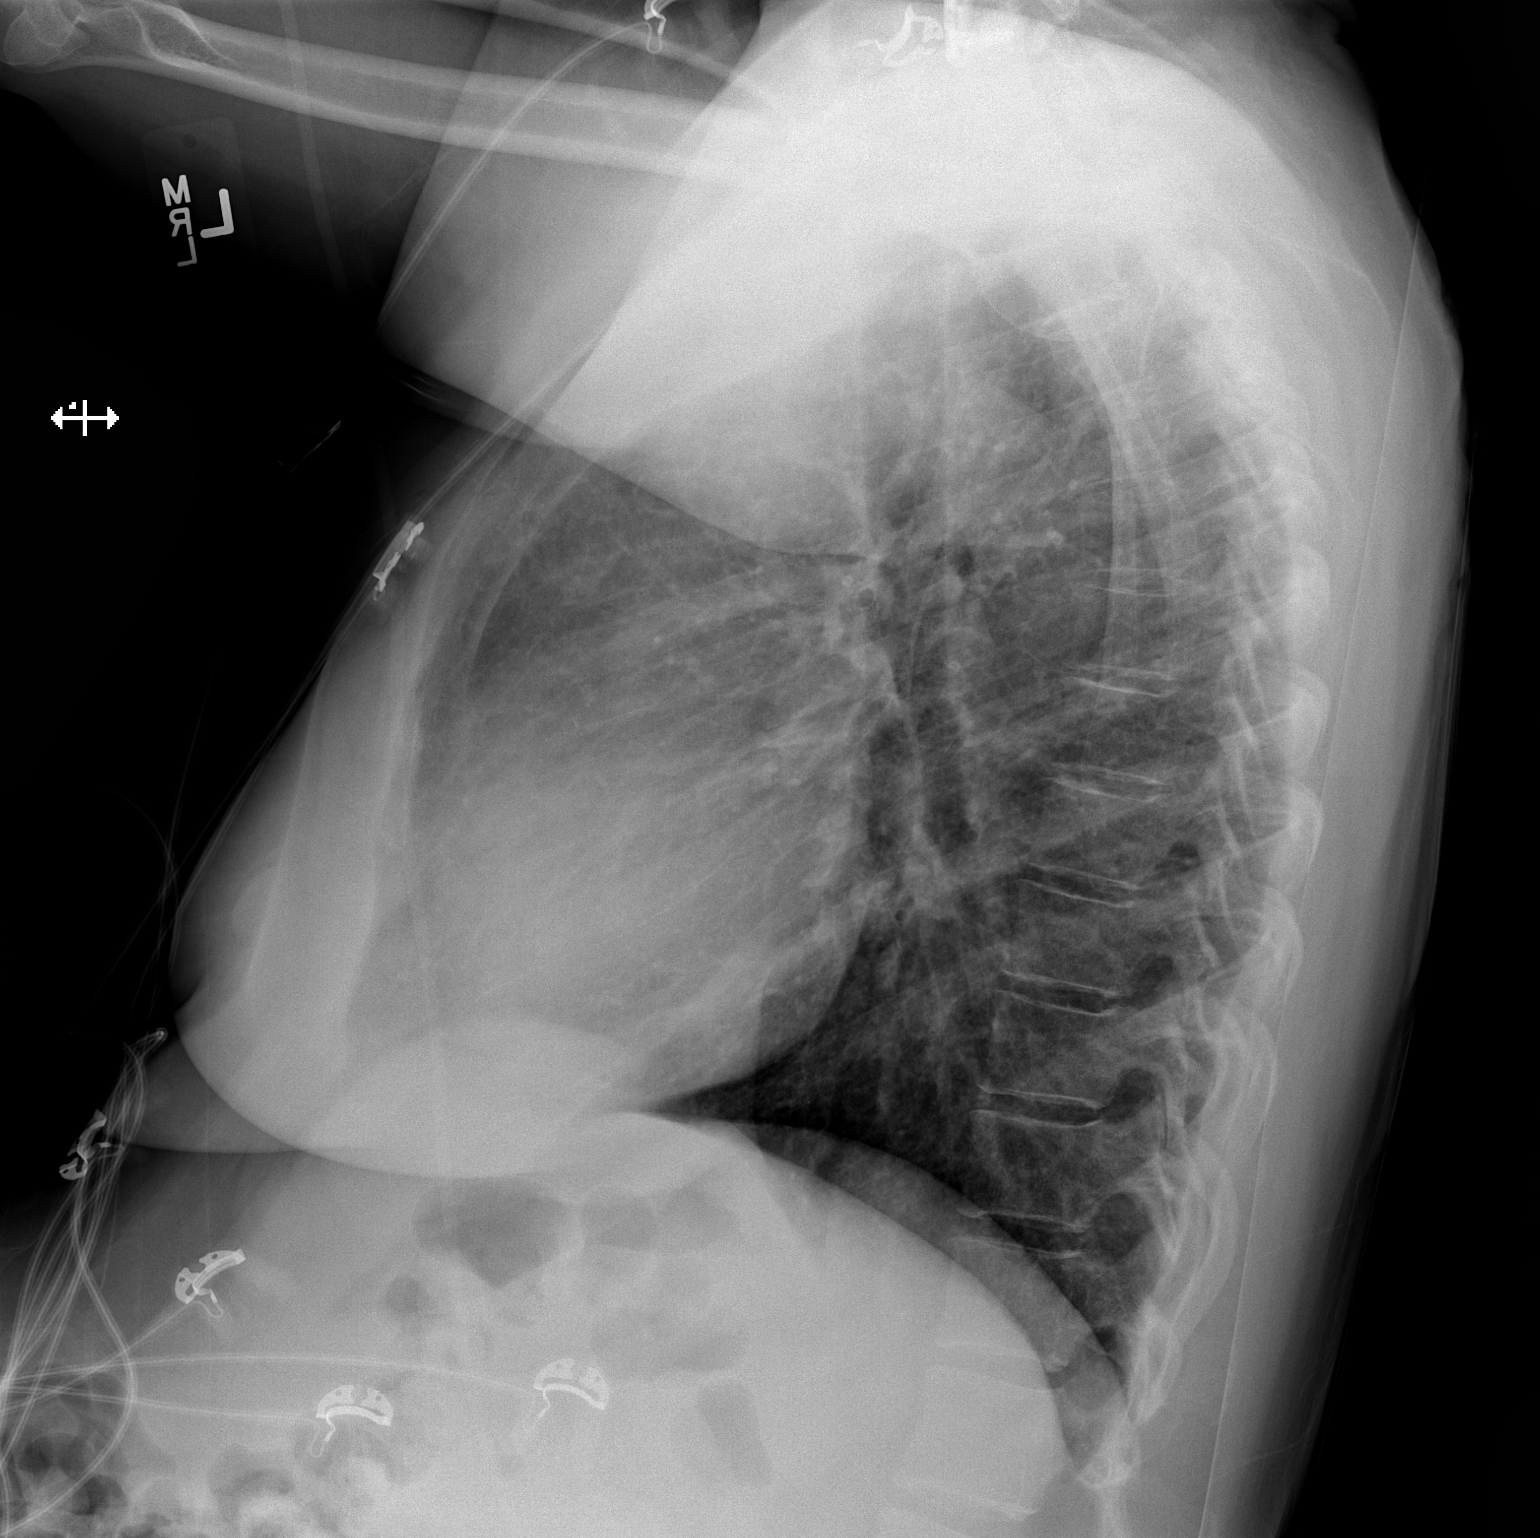

[x chest ap]
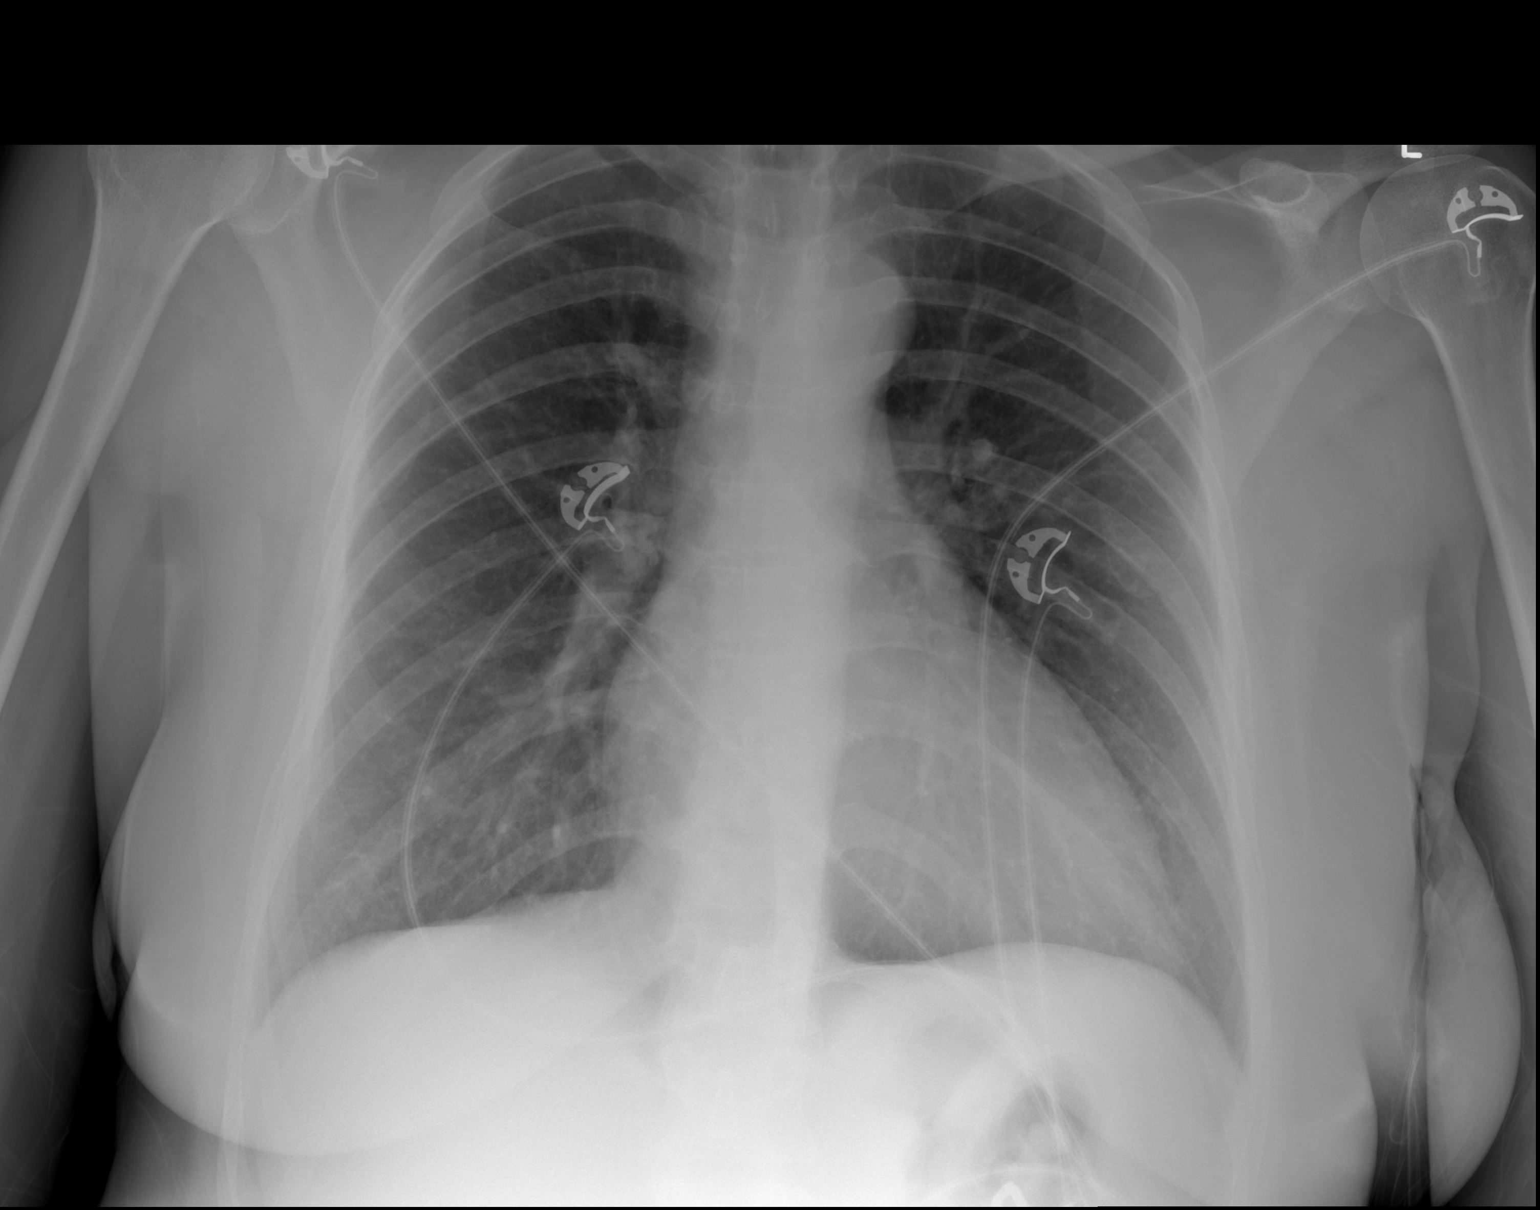

[2 of 2 positions shown; findings below may reference images not displayed]

FINDINGS: Borderline enlargement of cardiac silhouette, likely accentuated by
AP technique.
Mediastinal contours and pulmonary vascularity normal.
Lungs clear.
No pleural effusion or pneumothorax.
No acute osseous findings.
IMPRESSION: No acute abnormalities.

## 2015-04-19 DIAGNOSIS — I10 Essential (primary) hypertension: Secondary | ICD-10-CM | POA: Diagnosis present

## 2015-08-04 DIAGNOSIS — F4310 Post-traumatic stress disorder, unspecified: Secondary | ICD-10-CM | POA: Diagnosis present

## 2015-08-07 DIAGNOSIS — F1910 Other psychoactive substance abuse, uncomplicated: Secondary | ICD-10-CM | POA: Insufficient documentation

## 2015-08-08 IMAGING — CR DG ANKLE COMPLETE 3+V*L*
3 series · 3 of 3 positions shown · non-contrast
Comparison: None

CLINICAL DATA: Fell. Left ankle pain.

EXAM:
LEFT ANKLE COMPLETE - 3+ VIEW

[t ankle joint ap left]
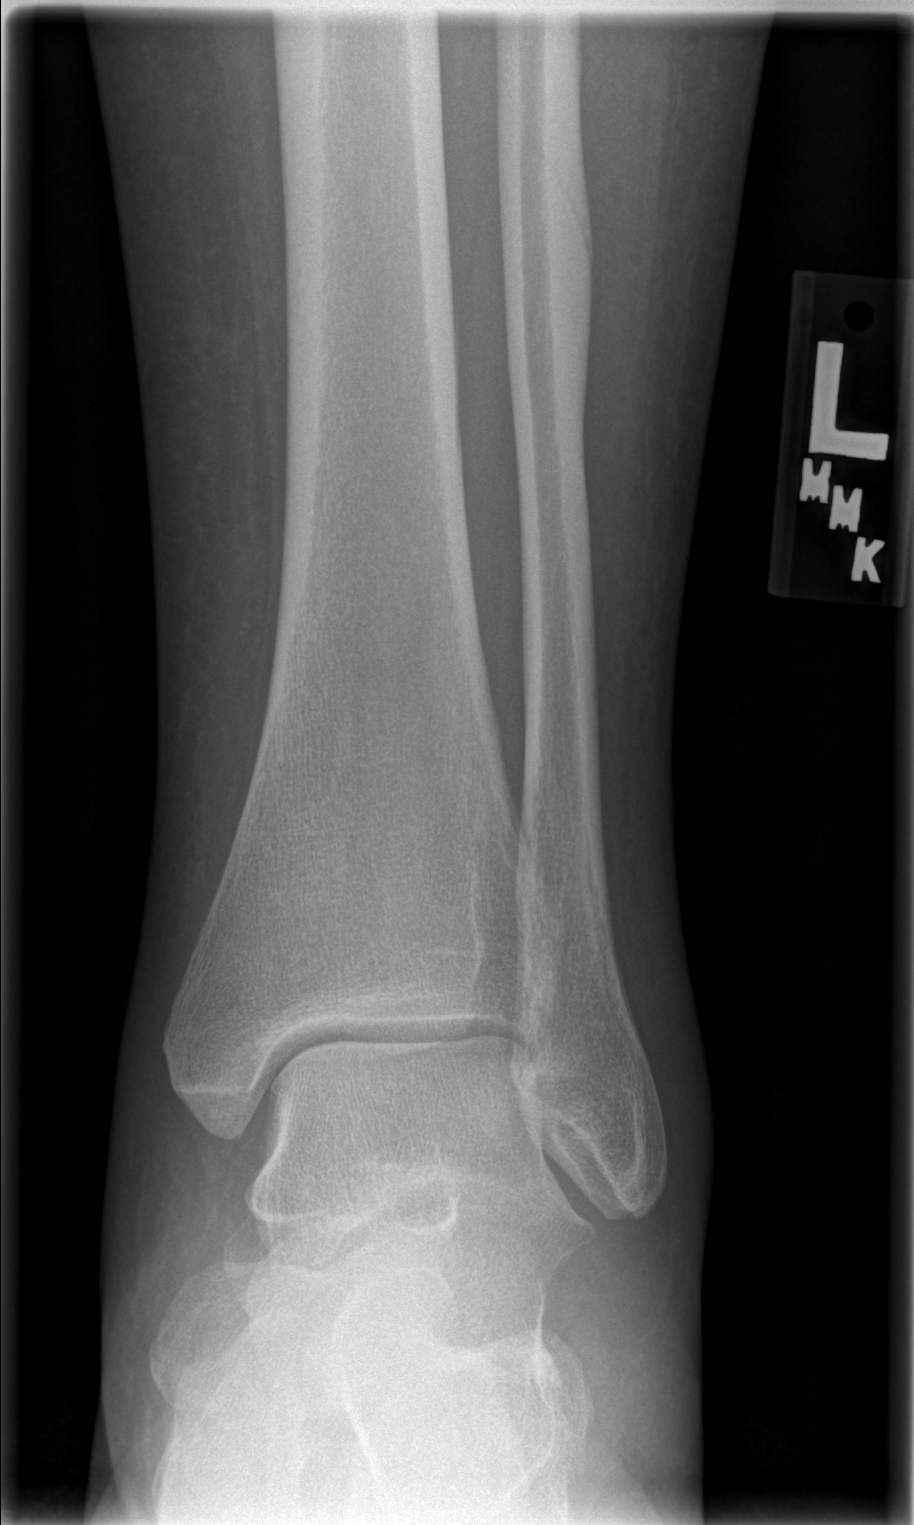

[t ankle joint oblique left]
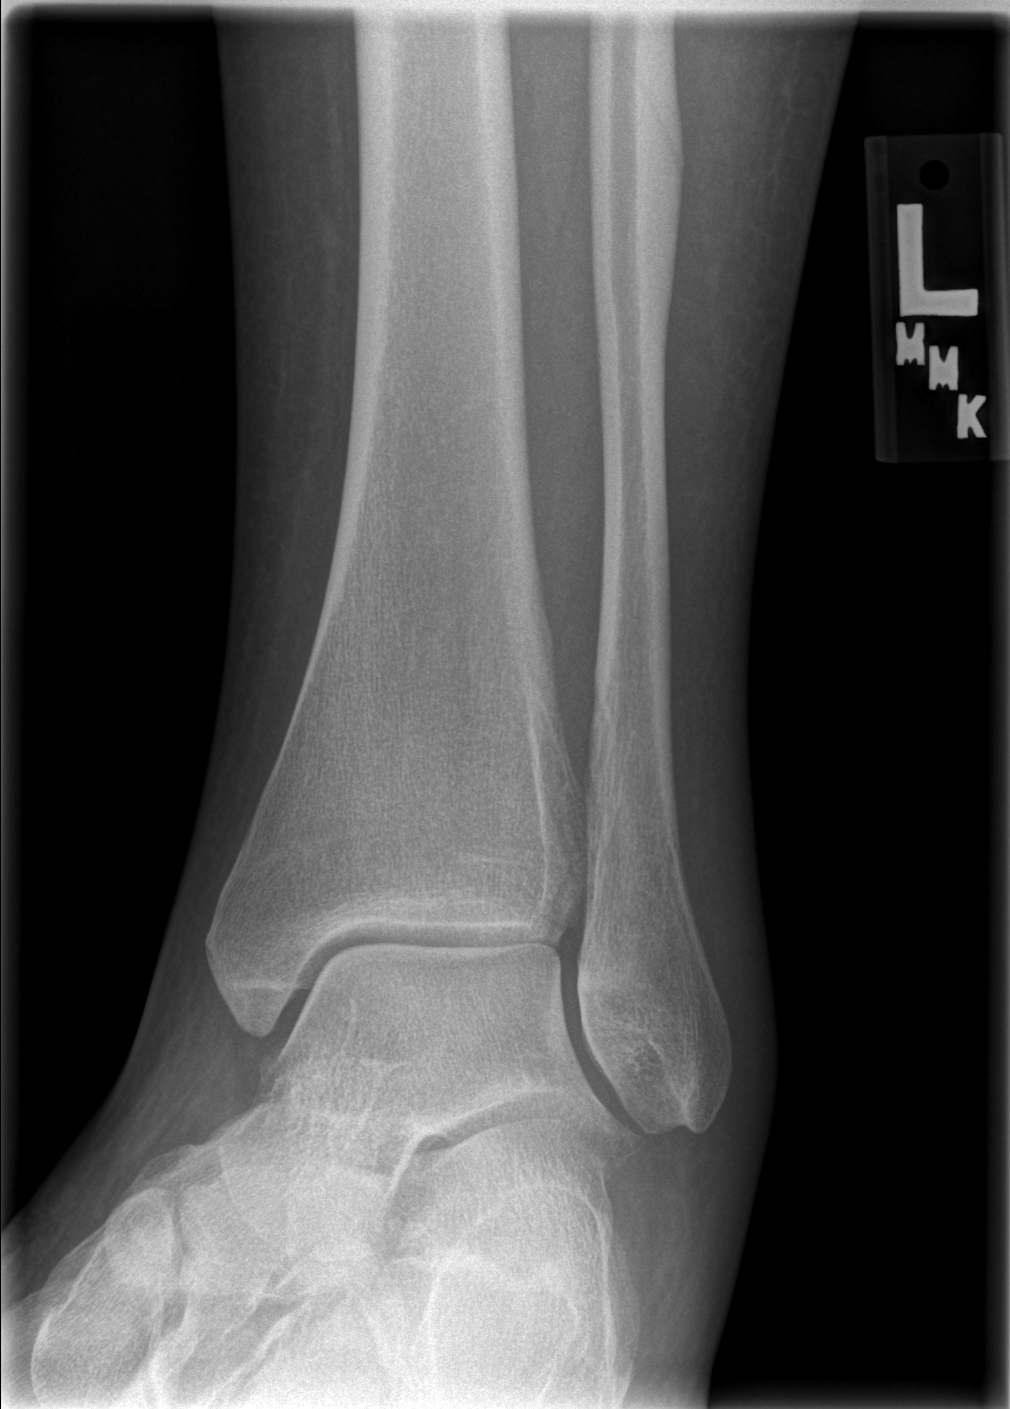

[t ankle joint lat left]
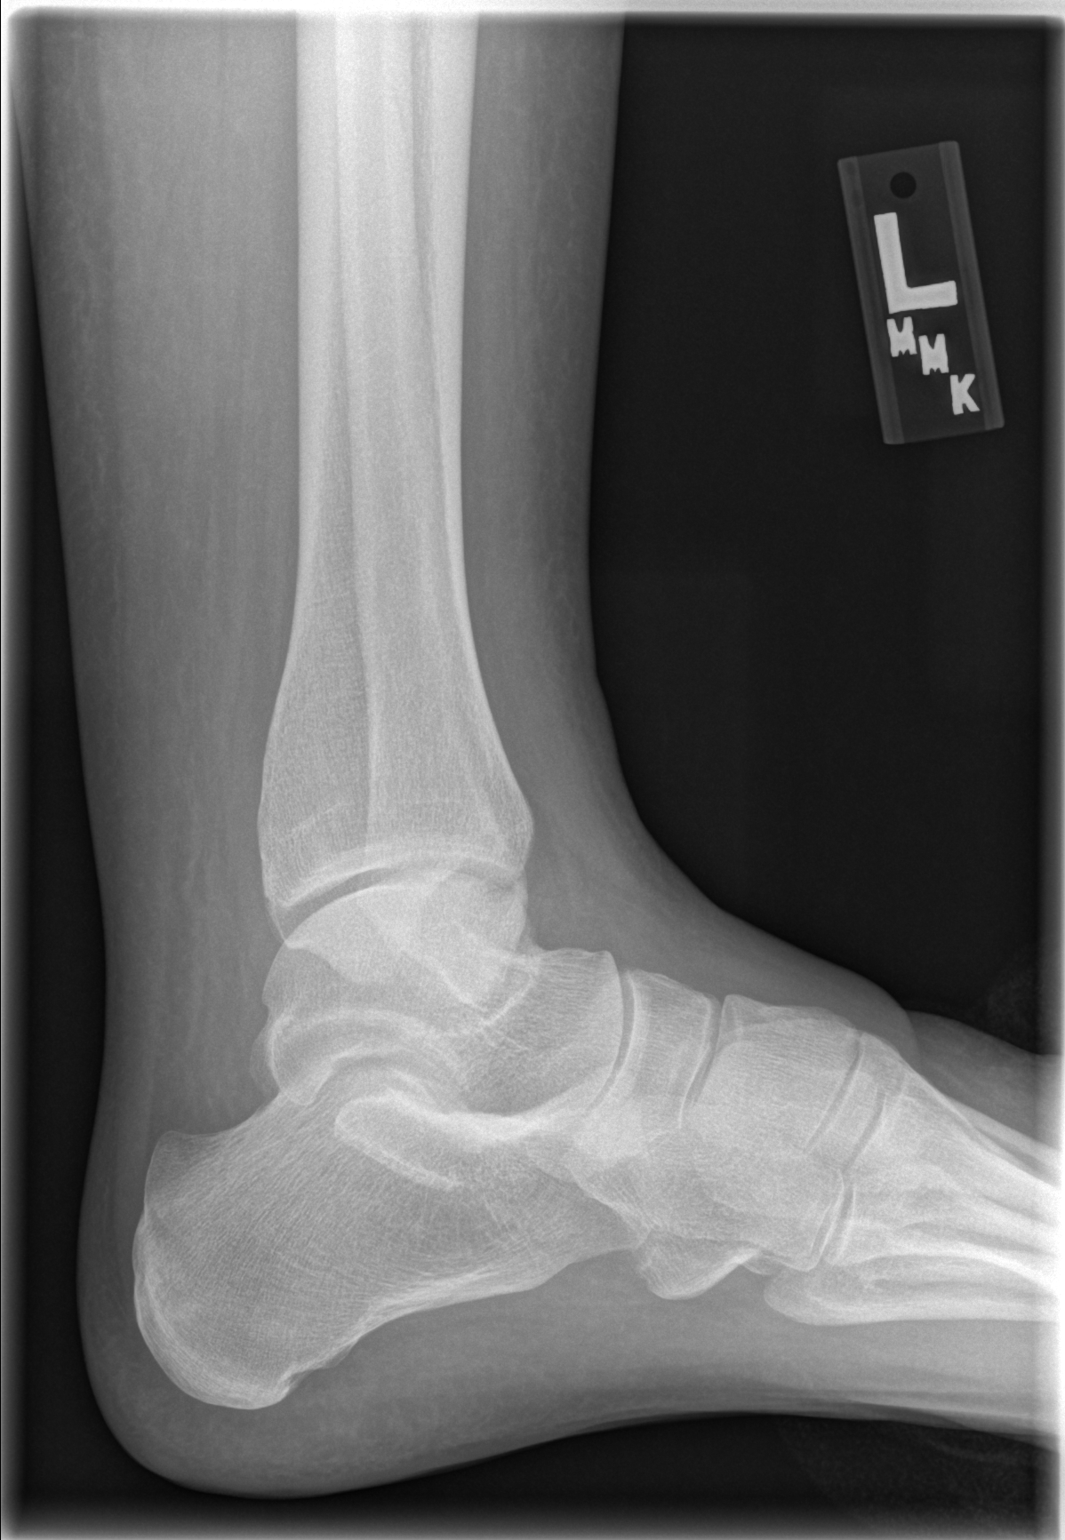

[3 of 3 positions shown; findings below may reference images not displayed]

FINDINGS: The ankle mortise is normal. No acute fracture or osteochondral
lesion. The mid and hindfoot bony structures are intact.
IMPRESSION: No acute bony findings.

## 2017-02-25 ENCOUNTER — Emergency Department (HOSPITAL_COMMUNITY)
Admission: EM | Admit: 2017-02-25 | Discharge: 2017-02-25 | Disposition: A | Discharge status: Home / Self Care | Payer: Medicaid Other | Attending: Emergency Medicine | Admitting: Emergency Medicine

## 2017-02-25 ENCOUNTER — Encounter (HOSPITAL_COMMUNITY): Payer: Self-pay | Admitting: *Deleted

## 2017-02-25 DIAGNOSIS — K047 Periapical abscess without sinus: Secondary | ICD-10-CM | POA: Insufficient documentation

## 2017-02-25 DIAGNOSIS — K0889 Other specified disorders of teeth and supporting structures: Secondary | ICD-10-CM

## 2017-02-25 DIAGNOSIS — F17200 Nicotine dependence, unspecified, uncomplicated: Secondary | ICD-10-CM | POA: Insufficient documentation

## 2017-02-25 MED ORDER — PENICILLIN V POTASSIUM 500 MG PO TABS: 500.0000 mg | ORAL_TABLET | Freq: Four times a day (QID) | ORAL | 0 refills | Status: AC

## 2017-02-25 MED ORDER — KETOROLAC TROMETHAMINE 30 MG/ML IJ SOLN
30.0000 mg | Freq: Once | INTRAMUSCULAR | Status: AC
  Administered 2017-02-25: 30 mg via INTRAMUSCULAR
  Filled 2017-02-25: qty 1

## 2017-02-25 MED ORDER — BENZOCAINE 20 % MT PSTE: 1.0000 "application " | PASTE | Freq: Four times a day (QID) | OROMUCOSAL | 0 refills | Status: DC | PRN

## 2017-02-25 MED ORDER — OXYCODONE-ACETAMINOPHEN 5-325 MG PO TABS
2.0000 | ORAL_TABLET | Freq: Once | ORAL | Status: AC
  Administered 2017-02-25: 2 via ORAL
  Filled 2017-02-25: qty 2

## 2017-02-25 NOTE — ED Provider Notes (Signed)
[place] DEPT Provider Note   CSN: [id number] Arrival date & time: 02/25/17  1955   By signing my name below, I, Collene Leyden, attest that this documentation has been prepared under the direction and in the presence of Carmon Sails, [place]. Electronically Signed: Collene Leyden, Scribe. 02/25/17. 9:42 PM.  History   Chief Complaint Chief Complaint  Patient presents with  . Dental Pain   HPI Comments: Sandra Atkinson is a 46 y.o. female with no pertinent medical history, who presents to the Emergency Department complaining of sudden-onset, constant right lower dental pain that began 3 days ago. Patient states she was eating an apple two seeks ago when she cracked her tooth. Patient states now her entire right lower "jaw" is hurting and began swelling 3 days ago. Patient states she has been unable to eat a meal in the past 4 days. Patient reports associated right-sided facial swelling, right ear pain. Patient reports taking tylenol (4 a day) and ibuprofen (2 a day) with no relief in pain. Patient does not have a primary dentist. Patient denies any nausea, vomiting, or fever.   The history is provided by the patient. No language interpreter was used.    Past Medical History:  Diagnosis Date  . Fibrodysplasia elastica generalisata    Hysterectomy  . Lung nodule     There are no active problems to display for this patient.   Past Surgical History:  Procedure Laterality Date  . ABDOMINAL HYSTERECTOMY    . ANTERIOR CERVICAL DECOMP/DISCECTOMY FUSION  02/05/2012   Procedure: ANTERIOR CERVICAL DECOMPRESSION/DISCECTOMY FUSION 1 LEVEL;  Surgeon: Kristeen Miss, MD;  Location: [facility];  Service: Neurosurgery;  Laterality: N/A;  Cervical Five-Six Anterior cervical decompression/diskectomy, fusion  . APPENDECTOMY    . LIVER SURGERY      OB History    No data available       Home Medications    Prior to Admission medications   Medication Sig Start Date End Date Taking?  Authorizing Provider  amphetamine-dextroamphetamine (ADDERALL) 15 MG tablet Take 15 mg by mouth 3 (three) times daily.    Historical Provider, MD  benzocaine (ORABASE-B) 20 % PSTE Use as directed 1 application in the mouth or throat 4 (four) times daily as needed for mouth pain. 02/25/17   Kinnie Feil, PA-C  divalproex (DEPAKOTE) 500 MG DR tablet Take 500 mg by mouth 3 (three) times daily.     Historical Provider, MD  escitalopram (LEXAPRO) 20 MG tablet Take 20 mg by mouth at bedtime.     Historical Provider, MD  gabapentin (NEURONTIN) 100 MG capsule Take 100 mg by mouth 3 (three) times daily. Nerve pain    Historical Provider, MD  metoprolol succinate (TOPROL-XL) 25 MG 24 hr tablet Take 25 mg by mouth daily.    Historical Provider, MD  oxymorphone (OPANA ER) 5 MG 12 hr tablet Take 5 mg by mouth 2 (two) times daily as needed for pain.    Historical Provider, MD  oxymorphone (OPANA) 10 MG tablet Take 10 mg by mouth 3 (three) times daily. scheduled    Historical Provider, MD  penicillin v potassium (VEETID) 500 MG tablet Take 1 tablet (500 mg total) by mouth 4 (four) times daily. 02/25/17 03/04/17  Kinnie Feil, PA-C  pravastatin (PRAVACHOL) 20 MG tablet Take 20 mg by mouth at bedtime.    Historical Provider, MD  prazosin (MINIPRESS) 2 MG capsule Take 4 mg by mouth at bedtime.    Historical Provider, MD  tiZANidine (  ZANAFLEX) 4 MG tablet Take 4 mg by mouth 3 (three) times daily. scheduled    Historical Provider, MD  traZODone (DESYREL) 150 MG tablet Take 150 mg by mouth at bedtime.    Historical Provider, MD    Family History Family History  Problem Relation Age of Onset  . Cancer Father   . Anesthesia problems Neg Hx     Social History Social History  Substance Use Topics  . Smoking status: Current Every Day Smoker    Packs/day: 0.50    Years: 20.00  . Smokeless tobacco: Never Used  . Alcohol use No     Allergies   Patient has no known allergies.   Review of Systems Review  of Systems  Constitutional: Negative for fever.  HENT: Positive for dental problem (left lower) and facial swelling (right sided lower jaw).   Respiratory: Negative for choking and shortness of breath.   Gastrointestinal: Negative for nausea and vomiting.     Physical Exam Updated Vital Signs BP 120/73   Pulse 77   Temp 97.6 F (36.4 C)   Resp 17   Ht 5\' 8"  (1.727 m)   Wt 79.4 kg   LMP 09/26/2010   SpO2 100%   BMI 26.61 kg/m   Physical Exam  Constitutional: She is oriented to person, place, and time. She appears well-developed and well-nourished. She is cooperative. No distress.  HENT:  Head: Normocephalic and atraumatic.  Nose: Nose normal.  Mouth/Throat: Oropharynx is clear and moist and mucous membranes are normal. There is trismus in the jaw. Abnormal dentition. Dental abscesses and dental caries present.    +Tenderness at tooth #32 with mild surrounding erythema, edema, fluctuance, scant amount of purulent discharge at gingival margin +Trismus +Mild right lower facial edema with tenderness, no anterior neck edema, erythema or erythema. No sublingual edema or tenderness.  Soft palate flat without tenderness.  No pooling of oral secretions.  Phonation normal, no hot potato voice.  Maxilla and mandible nontender. Mastoids without edema, erythema or tenderness.    Eyes: Conjunctivae and EOM are normal. No scleral icterus.  Cardiovascular: Normal rate, regular rhythm, normal heart sounds and intact distal pulses.   No murmur heard. Pulmonary/Chest: Effort normal and breath sounds normal. She has no wheezes.  Musculoskeletal: Normal range of motion. She exhibits no deformity.  Neurological: She is alert and oriented to person, place, and time.  Skin: Skin is warm and dry. Capillary refill takes less than 2 seconds.  Psychiatric: She has a normal mood and affect. Her behavior is normal. Judgment and thought content normal.  Nursing note and vitals reviewed.    ED  Treatments / Results  DIAGNOSTIC STUDIES: Oxygen Saturation is 100% on RA, normal by my interpretation.    COORDINATION OF CARE: 9:42 PM Discussed treatment plan with pt at bedside and pt agreed to plan, which includes antibiotics and pain medication.   Labs (all labs ordered are listed, but only abnormal results are displayed) Labs Reviewed - No data to display  EKG  EKG Interpretation None       Radiology No results found.  Procedures Procedures (including critical care time)  Medications Ordered in ED Medications  ketorolac (TORADOL) 30 MG/ML injection 30 mg (30 mg Intramuscular Given 02/25/17 2225)  oxyCODONE-acetaminophen (PERCOCET/ROXICET) 5-325 MG per tablet 2 tablet (2 tablets Oral Given 02/25/17 2225)     Initial Impression / Assessment and Plan / ED Course  I have reviewed the triage vital signs and the nursing notes.  Pertinent  labs & imaging results that were available during my care of the patient were reviewed by me and considered in my medical decision making (see chart for details).    Dental pain associated with cracked tooth with superimposed dental abscess.  Patient is afebrile, non toxic appearing, swallowing secretions well without hot potato voice. Exam unconcerning for Ludwig's angina or other deep tissue infection in neck.  As there is gum swelling with fluctuance, erythema, and mild facial swelling, will treat with antibiotic.  Urged patient to follow-up with dentist.  Patient given dentist contact info, encouraged to follow up in 1-2 days for ultimate management of dental pain and overall dental health. Strict ED return precautions given. Pt is aware of red flag symptoms that would warrant return to ED for re-evaluation and further treatment. Patient voices understanding and is agreeable to plan.  Ibuprofen/tylenol/benzocaine gel for pain.    Final Clinical Impressions(s) / ED Diagnoses   Final diagnoses:  Pain, dental  Dental abscess    New  Prescriptions Discharge Medication List as of 02/25/2017 10:06 PM    START taking these medications   Details  benzocaine (ORABASE-B) 20 % PSTE Use as directed 1 application in the mouth or throat 4 (four) times daily as needed for mouth pain., Starting Mon 02/25/2017, Print    penicillin v potassium (VEETID) 500 MG tablet Take 1 tablet (500 mg total) by mouth 4 (four) times daily., Starting Mon 02/25/2017, Until Mon 03/04/2017, Print       I personally performed the services described in this documentation, which was scribed in my presence. The recorded information has been reviewed and is accurate.    Kinnie Feil, PA-C 02/25/17 2356    Lacretia Leigh, MD 02/26/17 [phone]

## 2017-02-25 NOTE — ED Triage Notes (Signed)
Pt sts she started having tooth pain due to a cracked tooth, sts the pain started late Saturday night, as of this am her entire jaw has started swelling, no fever, sts Friday is the last day she has been able to eat a normal meal; pt took tylenol and motrin with no relief

## 2017-02-25 NOTE — Discharge Instructions (Signed)
Your signs and symptoms suggest early stages of a dental abscess from your recent cracked tooth.  Please take prescribed antibiotics.  IT IS IMPORTANT THAT YOU CALL DENTIST TOMORROW AND MAKE AN APPOINTMENT WITHIN 1-2 DAYS for re-evaluation and possible extraction.  Your tooth is the source of the infection and it will continue to hurt despite pain medications if it is not taken care of.    Take 800 mg ibuprofen + 1000 mg tylenol for pain every 8 hours.  Use prescribed topical numbing gel for additional relief.    Monitor for signs of worsening infection.  Return to the ED if you notice increase facial swelling, fevers, drooling or inability to close/open your jaw

## 2020-11-07 DIAGNOSIS — L03114 Cellulitis of left upper limb: Secondary | ICD-10-CM | POA: Insufficient documentation

## 2020-12-13 DIAGNOSIS — F331 Major depressive disorder, recurrent, moderate: Secondary | ICD-10-CM | POA: Insufficient documentation

## 2020-12-13 DIAGNOSIS — F1210 Cannabis abuse, uncomplicated: Secondary | ICD-10-CM | POA: Diagnosis present

## 2023-11-18 DIAGNOSIS — F1911 Other psychoactive substance abuse, in remission: Secondary | ICD-10-CM | POA: Diagnosis present

## 2024-04-06 ENCOUNTER — Encounter (HOSPITAL_COMMUNITY): Payer: Self-pay

## 2024-04-06 ENCOUNTER — Emergency Department (HOSPITAL_COMMUNITY): Payer: MEDICAID

## 2024-04-06 ENCOUNTER — Emergency Department (HOSPITAL_COMMUNITY)
Admission: EM | Admit: 2024-04-06 | Discharge: 2024-04-06 | Disposition: A | Discharge status: Home / Self Care | Payer: MEDICAID | Attending: Emergency Medicine | Admitting: Emergency Medicine

## 2024-04-06 ENCOUNTER — Other Ambulatory Visit: Payer: Self-pay

## 2024-04-06 DIAGNOSIS — M79661 Pain in right lower leg: Secondary | ICD-10-CM | POA: Diagnosis not present

## 2024-04-06 DIAGNOSIS — R0602 Shortness of breath: Secondary | ICD-10-CM | POA: Insufficient documentation

## 2024-04-06 DIAGNOSIS — R0789 Other chest pain: Secondary | ICD-10-CM | POA: Diagnosis not present

## 2024-04-06 DIAGNOSIS — R059 Cough, unspecified: Secondary | ICD-10-CM | POA: Insufficient documentation

## 2024-04-06 DIAGNOSIS — L03119 Cellulitis of unspecified part of limb: Secondary | ICD-10-CM

## 2024-04-06 DIAGNOSIS — D649 Anemia, unspecified: Secondary | ICD-10-CM | POA: Diagnosis not present

## 2024-04-06 DIAGNOSIS — M7989 Other specified soft tissue disorders: Secondary | ICD-10-CM | POA: Diagnosis present

## 2024-04-06 DIAGNOSIS — L03116 Cellulitis of left lower limb: Secondary | ICD-10-CM | POA: Diagnosis not present

## 2024-04-06 DIAGNOSIS — L03115 Cellulitis of right lower limb: Secondary | ICD-10-CM | POA: Diagnosis not present

## 2024-04-06 LAB — BASIC METABOLIC PANEL WITH GFR
Anion gap: 9 (ref 5–15)
BUN: 17 mg/dL (ref 6–20)
CO2: 25 mmol/L (ref 22–32)
Calcium: 8.3 mg/dL — ABNORMAL LOW (ref 8.9–10.3)
Chloride: 104 mmol/L (ref 98–111)
Creatinine, Ser: 0.78 mg/dL (ref 0.44–1.00)
GFR, Estimated: >60 mL/min (ref >60)
Glucose, Bld: 127 mg/dL — ABNORMAL HIGH (ref 70–99)
Potassium: 3.2 mmol/L — ABNORMAL LOW (ref 3.5–5.1)
Sodium: 138 mmol/L (ref 135–145)

## 2024-04-06 LAB — CBC
HCT: 34 % — ABNORMAL LOW (ref 36.0–46.0)
Hemoglobin: 10.7 g/dL — ABNORMAL LOW (ref 12.0–15.0)
MCH: 30.4 pg (ref 26.0–34.0)
MCHC: 31.5 g/dL (ref 30.0–36.0)
MCV: 96.6 fL (ref 80.0–100.0)
Platelets: 200 10*3/uL (ref 150–400)
RBC: 3.52 MIL/uL — ABNORMAL LOW (ref 3.87–5.11)
RDW: 13.3 % (ref 11.5–15.5)
WBC: 7.5 10*3/uL (ref 4.0–10.5)
nRBC: 0 % (ref 0.0–0.2)

## 2024-04-06 LAB — TROPONIN I (HIGH SENSITIVITY)
Troponin I (High Sensitivity): 4 ng/L (ref <18)
Troponin I (High Sensitivity): 5 ng/L (ref <18)

## 2024-04-06 LAB — BRAIN NATRIURETIC PEPTIDE: B Natriuretic Peptide: 37.2 pg/mL (ref 0.0–100.0)

## 2024-04-06 MED ORDER — IOHEXOL 350 MG/ML SOLN
75.0000 mL | Freq: Once | INTRAVENOUS | Status: AC | PRN
  Administered 2024-04-06: 75 mL via INTRAVENOUS

## 2024-04-06 MED ORDER — POTASSIUM CHLORIDE CRYS ER 20 MEQ PO TBCR
40.0000 meq | EXTENDED_RELEASE_TABLET | Freq: Once | ORAL | Status: AC
  Administered 2024-04-06: 40 meq via ORAL
  Filled 2024-04-06: qty 2

## 2024-04-06 MED ORDER — AMOXICILLIN-POT CLAVULANATE 875-125 MG PO TABS: 1.0000 | ORAL_TABLET | Freq: Two times a day (BID) | ORAL | 0 refills | Status: DC

## 2024-04-06 MED ORDER — FENTANYL CITRATE PF 50 MCG/ML IJ SOSY
50.0000 ug | PREFILLED_SYRINGE | Freq: Once | INTRAMUSCULAR | Status: AC
  Administered 2024-04-06: 50 ug via INTRAVENOUS
  Filled 2024-04-06: qty 1

## 2024-04-06 NOTE — ED Provider Notes (Signed)
 Sandra Atkinson Provider Note   CSN: [id number] Arrival date & time: 04/06/24  1148     History  Chief Complaint  Patient presents with   Bil Lower Leg Swelling    Sandra Atkinson is a 53 y.o. female.  HPI   53 year old female presenting to the emergency department with swelling and pain in the bilateral feet and ankles.  She endorses redness and pain along the left lower extremity more than the right.  She has been trying to elevate them without any relief.  She had stated that she had been having a fever to 1.4 for the last 2 days.  She has been having some intermittent shortness of breath and chest tightness.  Some difficulty with pain on taking a deep breath.  Mild nonproductive cough.  No history of DVT or PE.  Home Medications Prior to Admission medications   Medication Sig Start Date End Date Taking? Authorizing Provider  amoxicillin-clavulanate (AUGMENTIN) 875-125 MG tablet Take 1 tablet by mouth every 12 (twelve) hours. 04/06/24  Yes Trish Furl, MD  amphetamine-dextroamphetamine (ADDERALL) 15 MG tablet Take 15 mg by mouth 3 (three) times daily.    [provider]  benzocaine  (ORABASE-B) 20 % PSTE Use as directed 1 application in the mouth or throat 4 (four) times daily as needed for mouth pain. 02/25/17   Theodora Fish, PA-C  divalproex (DEPAKOTE) 500 MG DR tablet Take 500 mg by mouth 3 (three) times daily.     [provider]  escitalopram (LEXAPRO) 20 MG tablet Take 20 mg by mouth at bedtime.     [provider]  gabapentin (NEURONTIN) 100 MG capsule Take 100 mg by mouth 3 (three) times daily. Nerve pain    [provider]  metoprolol succinate (TOPROL-XL) 25 MG 24 hr tablet Take 25 mg by mouth daily.    [provider]  oxymorphone (OPANA ER) 5 MG 12 hr tablet Take 5 mg by mouth 2 (two) times daily as needed for pain.    [provider]  oxymorphone (OPANA) 10 MG tablet Take  10 mg by mouth 3 (three) times daily. scheduled    [provider]  pravastatin (PRAVACHOL) 20 MG tablet Take 20 mg by mouth at bedtime.    [provider]  prazosin (MINIPRESS) 2 MG capsule Take 4 mg by mouth at bedtime.    [provider]  tiZANidine (ZANAFLEX) 4 MG tablet Take 4 mg by mouth 3 (three) times daily. scheduled    [provider]  traZODone (DESYREL) 150 MG tablet Take 150 mg by mouth at bedtime.    [provider]      Allergies    Ibuprofen     Review of Systems   Review of Systems  All other systems reviewed and are negative.   Physical Exam Updated Vital Signs BP 123/83   Pulse 70   Temp 98.5 F (36.9 C) (Oral)   Resp 16   LMP 09/26/2010   SpO2 100%  Physical Exam Vitals and nursing note reviewed.  Constitutional:      General: She is not in acute distress.    Appearance: She is well-developed.  HENT:     Head: Normocephalic and atraumatic.  Eyes:     Conjunctiva/sclera: Conjunctivae normal.  Cardiovascular:     Rate and Rhythm: Normal rate and regular rhythm.  Pulmonary:     Effort: Pulmonary effort is normal. No respiratory distress.  Breath sounds: Normal breath sounds.  Abdominal:     Palpations: Abdomen is soft.     Tenderness: There is no abdominal tenderness.  Musculoskeletal:        General: Swelling present.     Cervical back: Neck supple.     Comments: 1+ bilateral lower extremity edema, significant tenderness to palpation in the left lower extremity with mild erythema, no crepitus.    Skin:    General: Skin is warm and dry.     Capillary Refill: Capillary refill takes less than 2 seconds.  Neurological:     Mental Status: She is alert.  Psychiatric:        Mood and Affect: Mood normal.     ED Results / Procedures / Treatments   Labs (all labs ordered are listed, but only abnormal results are displayed) Labs Reviewed  BASIC METABOLIC PANEL WITH GFR - Abnormal; Notable for the  following components:      Result Value   Potassium 3.2 (*)    Glucose, Bld 127 (*)    Calcium 8.3 (*)    All other components within normal limits  CBC - Abnormal; Notable for the following components:   RBC 3.52 (*)    Hemoglobin 10.7 (*)    HCT 34.0 (*)    All other components within normal limits  BRAIN NATRIURETIC PEPTIDE  TROPONIN I (HIGH SENSITIVITY)  TROPONIN I (HIGH SENSITIVITY)    EKG EKG Interpretation Date/Time:  Monday Apr 06 2024 12:29:35 EDT Ventricular Rate:  70 PR Interval:  150 QRS Duration:  98 QT Interval:  398 QTC Calculation: 429 R Axis:   59  Text Interpretation: Normal sinus rhythm Cannot rule out Anterior infarct , age undetermined Abnormal ECG No significant change since last tracing Confirmed by Rosealee Concha ([id number]) on 04/06/2024 3:00:01 PM  Radiology VAS US  LOWER EXTREMITY VENOUS (DVT) (ONLY [facility] & [facility]) Result Date: 04/06/2024  Lower Venous DVT Study Patient Name:  Sandra Atkinson  Date of Exam:   04/06/2024 Medical Rec #: [id number]         Accession #:    [id number] Date of Birth: 1971-10-09        Patient Gender: F Patient Age:   46 years Exam Location:  [facility] Procedure:      VAS US  LOWER EXTREMITY VENOUS (DVT) Referring Phys: Rosealee Concha --------------------------------------------------------------------------------  Indications: Pain.  Risk Factors: None identified. Limitations: Poor ultrasound/tissue interface and patient pain tolerance. Comparison Study: No prior studies. Performing Technologist: Lerry Ransom RVT  Examination Guidelines: A complete evaluation includes B-mode imaging, spectral Doppler, color Doppler, and power Doppler as needed of all accessible portions of each vessel. Bilateral testing is considered an integral part of a complete examination. Limited examinations for reoccurring indications may be performed as noted. The reflux portion of the exam is performed with the patient in reverse Trendelenburg.   +---------+---------------+---------+-----------+----------+--------------+ RIGHT    CompressibilityPhasicitySpontaneityPropertiesThrombus Aging +---------+---------------+---------+-----------+----------+--------------+ CFV      Full           Yes      Yes                                 +---------+---------------+---------+-----------+----------+--------------+ SFJ      Full                                                        +---------+---------------+---------+-----------+----------+--------------+  FV Prox  Full                                                        +---------+---------------+---------+-----------+----------+--------------+ FV Mid   Full                                                        +---------+---------------+---------+-----------+----------+--------------+ FV DistalFull                                                        +---------+---------------+---------+-----------+----------+--------------+ PFV      Full                                                        +---------+---------------+---------+-----------+----------+--------------+ POP      Full           Yes      Yes                                 +---------+---------------+---------+-----------+----------+--------------+ PTV      Full                                                        +---------+---------------+---------+-----------+----------+--------------+ PERO     Full                                                        +---------+---------------+---------+-----------+----------+--------------+   +---------+---------------+---------+-----------+----------+--------------+ LEFT     CompressibilityPhasicitySpontaneityPropertiesThrombus Aging +---------+---------------+---------+-----------+----------+--------------+ CFV      Full           Yes      Yes                                  +---------+---------------+---------+-----------+----------+--------------+ SFJ      Full                                                        +---------+---------------+---------+-----------+----------+--------------+ FV Prox  Full                                                        +---------+---------------+---------+-----------+----------+--------------+  FV Mid   Full                                                        +---------+---------------+---------+-----------+----------+--------------+ FV DistalFull                                                        +---------+---------------+---------+-----------+----------+--------------+ PFV      Full                                                        +---------+---------------+---------+-----------+----------+--------------+ POP      Full           Yes      Yes                                 +---------+---------------+---------+-----------+----------+--------------+ PTV      Full                                                        +---------+---------------+---------+-----------+----------+--------------+ PERO     Full                                                        +---------+---------------+---------+-----------+----------+--------------+     Summary: RIGHT: - There is no evidence of deep vein thrombosis in the lower extremity.  - No cystic structure found in the popliteal fossa.  LEFT: - There is no evidence of deep vein thrombosis in the lower extremity.  - No cystic structure found in the popliteal fossa.  *See table(s) above for measurements and observations. Electronically signed by Irvin Mantel on 04/06/2024 at 5:19:40 PM.    Final    CT Angio Chest PE W and/or Wo Contrast Result Date: 04/06/2024 CLINICAL DATA:  Swelling in the lower legs fever EXAM: CT ANGIOGRAPHY CHEST WITH CONTRAST TECHNIQUE: Multidetector CT imaging of the chest was performed using the standard protocol  during bolus administration of intravenous contrast. Multiplanar CT image reconstructions and MIPs were obtained to evaluate the vascular anatomy. RADIATION DOSE REDUCTION: This exam was performed according to the departmental dose-optimization program which includes automated exposure control, adjustment of the mA and/or kV according to patient size and/or use of iterative reconstruction technique. CONTRAST:  75mL OMNIPAQUE  IOHEXOL  350 MG/ML SOLN COMPARISON:  Chest x-ray 04/06/2024, CT chest 12/04/2022 FINDINGS: Cardiovascular: Satisfactory opacification of the pulmonary arteries to the segmental level. No evidence of pulmonary embolism. Mild atherosclerosis. No aneurysm. Normal cardiac size. No pericardial effusion Mediastinum/Nodes: Patent trachea. No thyroid mass. No suspicious lymph nodes. Esophagus within normal limits Lungs/Pleura: Lungs are clear. No pleural effusion  or pneumothorax. Upper Abdomen: No acute findings Musculoskeletal: No acute osseous Abner Review of the MIP images confirms the above findings. IMPRESSION: 1. Negative for acute pulmonary embolus. 2. Clear lung fields Aortic Atherosclerosis (ICD10-I70.0). Electronically Signed   By: Esmeralda Hedge M.D.   On: 04/06/2024 17:08   DG Chest 2 View Result Date: 04/06/2024 CLINICAL DATA:  Chest pain and shortness of breath EXAM: CHEST - 2 VIEW COMPARISON:  Chest radiograph 12/04/2022 FINDINGS: The heart size and mediastinal contours are within normal limits. Both lungs are clear. The visualized skeletal structures are unremarkable. IMPRESSION: No active cardiopulmonary disease. Electronically Signed   By: Jone Neither M.D.   On: 04/06/2024 14:25    Procedures Procedures    Medications Ordered in ED Medications  potassium chloride SA (KLOR-CON M) CR tablet 40 mEq (40 mEq Oral Given 04/06/24 1629)  iohexol  (OMNIPAQUE ) 350 MG/ML injection 75 mL (75 mLs Intravenous Contrast Given 04/06/24 1600)  fentaNYL (SUBLIMAZE) injection 50 mcg (50 mcg  Intravenous Given 04/06/24 1648)    ED Course/ Medical Decision Making/ A&P                                 Medical Decision Making Amount and/or Complexity of Data Reviewed Labs: ordered. Radiology: ordered.  Risk Prescription drug management.    53 year old female presenting to the emergency department with swelling and pain in the bilateral feet and ankles.  She endorses redness and pain along the left lower extremity more than the right.  She has been trying to elevate them without any relief.  She had stated that she had been having a fever to 1.4 for the last 2 days.  She has been having some intermittent shortness of breath and chest tightness.  Some difficulty with pain on taking a deep breath.  Mild nonproductive cough.  No history of DVT or PE.  On arrival, the patient was afebrile, not tachycardic or tachypneic, he medically stable, saturating well on room air.  Physical exam revealed bilateral lower extremity edema, erythema and tenderness to palpation worse in the left lower extremity.  Considered edema from undiagnosed CHF, considered venous stasis and superimposed cellulitis in the left.  Considered DVT.  Considered PE.  Obtain screening labs, EKG, imaging evaluation to include DVT US  and CT imaging.  Laboratory evaluation revealed cardiac troponin 5, BMP unremarkable, CBC without a leukocytosis, mild anemia to 10.7, BMP with a potassium of 3.2, otherwise generally unremarkable.  DVT ultrasound was performed which revealed no evidence of DVT, chest x-ray without focal consolidation, no evidence of pneumothorax or pneumonia, no evidence of pulmonary edema.  CTA PE study was obtained and pending at time of signout.  Patient administered for pain control.  Plan at time of signout to follow-up results of CT imaging, if negative, would consider discharging the patient on a course of antibiotics due to concern for potential left lower extremity cellulitis.  Signout given to Dr. Monnie Anthony at  1700.    Final Clinical Impression(s) / ED Diagnoses Final diagnoses:  Leg swelling  Cellulitis of lower extremity, unspecified laterality    Rx / DC Orders ED Discharge Orders          Ordered    amoxicillin-clavulanate (AUGMENTIN) 875-125 MG tablet  Every 12 hours        04/06/24 1744              Rosealee Concha, MD 04/06/24 2019

## 2024-04-06 NOTE — ED Notes (Signed)
 Pt returned from CT

## 2024-04-06 NOTE — ED Notes (Signed)
VAS Tech was paged

## 2024-04-06 NOTE — ED Triage Notes (Addendum)
 Pt came in via POV d/t bil feet/ankles/lower legs swollen, red & painful. Reports trying to elevate them & nothing is helping. A/Ox4, rates her pain 10/10 during triage. Also endorses 101.4 Temp last 2 days with some intermittent SOB & tight CP, afebrile in triage.

## 2024-04-06 NOTE — ED Notes (Signed)
 This RN attempted to contact pt next of kin with no answer.

## 2024-04-06 NOTE — Progress Notes (Signed)
 Bilateral lower extremity venous duplex has been completed. Preliminary results can be found in CV Proc through chart review.  Results were given to Dr. Adrain Alar.  04/06/24 4:55 PM Birda Buffy RVT

## 2024-04-06 NOTE — Discharge Instructions (Addendum)
 Take antibiotics as prescribed.  Follow-up with her doctor in a week or so to make sure the symptoms are improving.  Return to the ED for high fevers or other concerning symptoms

## 2024-04-06 NOTE — ED Notes (Signed)
 Personnel officer made aware of pt leaving with IV in arm.

## 2024-04-06 NOTE — ED Provider Notes (Signed)
 Patient initially seen by Dr. Darwin English.  Please see his note.  Plans were to follow-up on the patient's CT scan which were still pending at the time of shift change. Patient had been seen for trouble with lower extremity swellings.  She had Doppler studies that was negative for DVT.  Patient also had a CT angiogram.  That is negative for acute PE.   Trish Furl, MD 04/06/24 1745

## 2024-04-06 NOTE — ED Notes (Signed)
 [place] at bedside

## 2024-04-06 NOTE — ED Notes (Signed)
 Pt requests pain meds. Message sent to MD

## 2024-04-06 NOTE — ED Notes (Addendum)
 Patient transported to CT

## 2024-04-06 NOTE — ED Notes (Signed)
 This RN entered pt room to provide discharge instructions. Pt not in room.  This RN checked with Dr Adrain Alar who states he told her she was being discharged home but to wait for paperwork.  Pt had IV in arm, no signs of IV being removed in room. This RN attempted to call pt home number with no answer.

## 2024-04-12 ENCOUNTER — Encounter (HOSPITAL_COMMUNITY): Payer: Self-pay

## 2024-04-12 ENCOUNTER — Emergency Department (HOSPITAL_COMMUNITY)
Admission: EM | Admit: 2024-04-12 | Discharge: 2024-04-12 | Disposition: A | Discharge status: Home / Self Care | Payer: MEDICAID | Attending: Emergency Medicine | Admitting: Emergency Medicine

## 2024-04-12 ENCOUNTER — Other Ambulatory Visit: Payer: Self-pay

## 2024-04-12 DIAGNOSIS — E876 Hypokalemia: Secondary | ICD-10-CM | POA: Diagnosis not present

## 2024-04-12 DIAGNOSIS — R443 Hallucinations, unspecified: Secondary | ICD-10-CM | POA: Diagnosis present

## 2024-04-12 LAB — CBC
HCT: 32.4 % — ABNORMAL LOW (ref 36.0–46.0)
Hemoglobin: 10.5 g/dL — ABNORMAL LOW (ref 12.0–15.0)
MCH: 30.5 pg (ref 26.0–34.0)
MCHC: 32.4 g/dL (ref 30.0–36.0)
MCV: 94.2 fL (ref 80.0–100.0)
Platelets: 254 10*3/uL (ref 150–400)
RBC: 3.44 MIL/uL — ABNORMAL LOW (ref 3.87–5.11)
RDW: 13.5 % (ref 11.5–15.5)
WBC: 6.1 10*3/uL (ref 4.0–10.5)
nRBC: 0 % (ref 0.0–0.2)

## 2024-04-12 LAB — COMPREHENSIVE METABOLIC PANEL WITH GFR
ALT: 97 U/L — ABNORMAL HIGH (ref 0–44)
AST: 81 U/L — ABNORMAL HIGH (ref 15–41)
Albumin: 3.7 g/dL (ref 3.5–5.0)
Alkaline Phosphatase: 81 U/L (ref 38–126)
Anion gap: 11 (ref 5–15)
BUN: 16 mg/dL (ref 6–20)
CO2: 23 mmol/L (ref 22–32)
Calcium: 8.6 mg/dL — ABNORMAL LOW (ref 8.9–10.3)
Chloride: 107 mmol/L (ref 98–111)
Creatinine, Ser: 0.85 mg/dL (ref 0.44–1.00)
GFR, Estimated: >60 mL/min (ref >60)
Glucose, Bld: 106 mg/dL — ABNORMAL HIGH (ref 70–99)
Potassium: 2.9 mmol/L — ABNORMAL LOW (ref 3.5–5.1)
Sodium: 141 mmol/L (ref 135–145)
Total Bilirubin: 0.9 mg/dL (ref 0.0–1.2)
Total Protein: 7.5 g/dL (ref 6.5–8.1)

## 2024-04-12 LAB — ETHANOL: Alcohol, Ethyl (B): <15 mg/dL (ref <15)

## 2024-04-12 MED ORDER — POTASSIUM CHLORIDE IN NACL 20-0.9 MEQ/L-% IV SOLN
Freq: Once | INTRAVENOUS | Status: AC
  Filled 2024-04-12: qty 1000

## 2024-04-12 MED ORDER — SODIUM CHLORIDE 0.9 % IV BOLUS
1000.0000 mL | Freq: Once | INTRAVENOUS | Status: AC
  Administered 2024-04-12: 1000 mL via INTRAVENOUS

## 2024-04-12 NOTE — Discharge Instructions (Signed)
 Today's evaluation has been reassuring.  Please be sure to follow-up with our behavioral health center for ongoing management of your medications.  Return here for concerning changes in your condition.

## 2024-04-12 NOTE — ED Provider Notes (Signed)
 [place] EMERGENCY DEPARTMENT AT [facility] Provider Note   CSN: [id number] Arrival date & time: 04/12/24  1515     History  Chief Complaint  Patient presents with   Hallucinations    Sandra Atkinson is a 53 y.o. female.  HPI Patient presents without pain, but with concern for hallucinations.  Seemingly the patient took a drink from someone she does not know, began to feel funny subsequently.  Patient states that she is generally well, but after this episode, she noticed that her bike, her backpack were missing and she was not feeling well.  No suicidal, no homicidal ideation, no focal pain.  She states that she is generally well, denies medical problems or taking medication regularly.    Home Medications Prior to Admission medications   Medication Sig Start Date End Date Taking? Authorizing Provider  amoxicillin -clavulanate (AUGMENTIN ) 875-125 MG tablet Take 1 tablet by mouth every 12 (twelve) hours. 04/06/24   Trish Furl, MD  amphetamine-dextroamphetamine (ADDERALL) 15 MG tablet Take 15 mg by mouth 3 (three) times daily.    [provider]  benzocaine  (ORABASE-B) 20 % PSTE Use as directed 1 application in the mouth or throat 4 (four) times daily as needed for mouth pain. 02/25/17   Theodora Fish, PA-C  divalproex (DEPAKOTE) 500 MG DR tablet Take 500 mg by mouth 3 (three) times daily.     [provider]  escitalopram (LEXAPRO) 20 MG tablet Take 20 mg by mouth at bedtime.     [provider]  gabapentin (NEURONTIN) 100 MG capsule Take 100 mg by mouth 3 (three) times daily. Nerve pain    [provider]  metoprolol succinate (TOPROL-XL) 25 MG 24 hr tablet Take 25 mg by mouth daily.    [provider]  oxymorphone (OPANA ER) 5 MG 12 hr tablet Take 5 mg by mouth 2 (two) times daily as needed for pain.    [provider]  oxymorphone (OPANA) 10 MG tablet Take 10 mg by mouth 3 (three) times daily. scheduled     [provider]  pravastatin (PRAVACHOL) 20 MG tablet Take 20 mg by mouth at bedtime.    [provider]  prazosin (MINIPRESS) 2 MG capsule Take 4 mg by mouth at bedtime.    [provider]  tiZANidine (ZANAFLEX) 4 MG tablet Take 4 mg by mouth 3 (three) times daily. scheduled    [provider]  traZODone (DESYREL) 150 MG tablet Take 150 mg by mouth at bedtime.    [provider]      Allergies    Ibuprofen     Review of Systems   Review of Systems  Physical Exam Updated Vital Signs BP 117/83   Pulse 63   Temp 97.6 F (36.4 C) (Oral)   Resp 12   LMP 09/26/2010   SpO2 100%  Physical Exam Vitals and nursing note reviewed.  Constitutional:      General: She is not in acute distress.    Appearance: She is well-developed.  HENT:     Head: Normocephalic and atraumatic.     Comments: Largely edentulous, otherwise unremarkable head exam Eyes:     Conjunctiva/sclera: Conjunctivae normal.  Cardiovascular:     Rate and Rhythm: Normal rate and regular rhythm.  Pulmonary:     Effort: Pulmonary effort is normal. No respiratory distress.     Breath sounds: Normal breath sounds. No stridor.  Abdominal:     General: There is no distension.  Skin:    General: Skin is warm and dry.  Neurological:     Mental Status: She is alert and oriented to person, place, and time.     Cranial Nerves: No cranial nerve deficit.  Psychiatric:        Mood and Affect: Mood normal.        Thought Content: Thought content is not paranoid. Thought content does not include homicidal or suicidal ideation.     Comments: Patient with rapid speech, tangential historian, no overt interaction with nonpresent entities.     ED Results / Procedures / Treatments   Labs (all labs ordered are listed, but only abnormal results are displayed) Labs Reviewed  COMPREHENSIVE METABOLIC PANEL WITH GFR - Abnormal; Notable for the following components:      Result Value    Potassium 2.9 (*)    Glucose, Bld 106 (*)    Calcium 8.6 (*)    AST 81 (*)    ALT 97 (*)    All other components within normal limits  CBC - Abnormal; Notable for the following components:   RBC 3.44 (*)    Hemoglobin 10.5 (*)    HCT 32.4 (*)    All other components within normal limits  ETHANOL  RAPID URINE DRUG SCREEN, HOSP PERFORMED    EKG None  Radiology No results found.  Procedures Procedures    Medications Ordered in ED Medications  sodium chloride  0.9 % bolus 1,000 mL (1,000 mLs Intravenous New Bag/Given 04/12/24 1654)  0.9 % NaCl with KCl 20 mEq/ L  infusion ( Intravenous New Bag/Given 04/12/24 1805)    ED Course/ Medical Decision Making/ A&P                                 Medical Decision Making Patient in no distress, hemodynamically stable presents with perseverates on story about losing her bike, backpack, possibly drinking from individuals she does not know.  Patient's physical exam is reassuring, though there are some marks on her arm suggesting possible illicit substance use versus minor trauma.  Suspicion for intoxication versus other electrolyte abnormalities, infection, dehydration.  Labs fluids started.  Amount and/or Complexity of Data Reviewed External Data Reviewed: notes. Labs: ordered. Decision-making details documented in ED Course.  Risk Prescription drug management.   8:31 PM Patient in no distress, sleeping, has had potassium repletion, has demonstrated no overt psychosis, no suicidal ideation, no homicidal ideation.  With reassuring vital signs, no decompensation after hours of monitoring, patient discharged to follow-up with her behavioral health center as an outpatient, primary care.  Patient provided additional resources for shelters.        Final Clinical Impression(s) / ED Diagnoses Final diagnoses:  Hallucinations  Hypokalemia    Rx / DC Orders ED Discharge Orders     None         Dorenda Gandy, MD 04/12/24  2032

## 2024-04-12 NOTE — ED Triage Notes (Signed)
 Pt states she took a drink from some strangers and shortly after she began to feel funny. Pt states she heard her fiance voice even though he was not there. Her bike and backpack went missing. Pt denies any SI/HI. Pt states she is still hearing her fiance, states they are arguing.  Pt has spots on her arm that are red and swollen.

## 2024-05-12 DIAGNOSIS — S61451S Open bite of right hand, sequela: Secondary | ICD-10-CM

## 2024-05-14 DIAGNOSIS — A4902 Methicillin resistant Staphylococcus aureus infection, unspecified site: Secondary | ICD-10-CM | POA: Diagnosis present

## 2024-05-20 ENCOUNTER — Other Ambulatory Visit: Payer: Self-pay

## 2024-05-20 ENCOUNTER — Inpatient Hospital Stay (HOSPITAL_COMMUNITY)
Admission: EM | Admit: 2024-05-20 | Discharge: 2024-05-22 | DRG: 607 | Disposition: A | Discharge status: Home / Self Care | Payer: MEDICAID | Attending: Internal Medicine | Admitting: Internal Medicine

## 2024-05-20 DIAGNOSIS — F1519 Other stimulant abuse with unspecified stimulant-induced disorder: Secondary | ICD-10-CM | POA: Diagnosis present

## 2024-05-20 DIAGNOSIS — S61451S Open bite of right hand, sequela: Secondary | ICD-10-CM | POA: Diagnosis not present

## 2024-05-20 DIAGNOSIS — L270 Generalized skin eruption due to drugs and medicaments taken internally: Principal | ICD-10-CM | POA: Diagnosis present

## 2024-05-20 DIAGNOSIS — F4310 Post-traumatic stress disorder, unspecified: Secondary | ICD-10-CM | POA: Diagnosis present

## 2024-05-20 DIAGNOSIS — T50995A Adverse effect of other drugs, medicaments and biological substances, initial encounter: Secondary | ICD-10-CM | POA: Diagnosis present

## 2024-05-20 DIAGNOSIS — Z90710 Acquired absence of both cervix and uterus: Secondary | ICD-10-CM

## 2024-05-20 DIAGNOSIS — W540XXS Bitten by dog, sequela: Secondary | ICD-10-CM

## 2024-05-20 DIAGNOSIS — F1210 Cannabis abuse, uncomplicated: Secondary | ICD-10-CM | POA: Diagnosis present

## 2024-05-20 DIAGNOSIS — T50905A Adverse effect of unspecified drugs, medicaments and biological substances, initial encounter: Secondary | ICD-10-CM | POA: Diagnosis not present

## 2024-05-20 DIAGNOSIS — K219 Gastro-esophageal reflux disease without esophagitis: Secondary | ICD-10-CM | POA: Diagnosis not present

## 2024-05-20 DIAGNOSIS — I10 Essential (primary) hypertension: Secondary | ICD-10-CM

## 2024-05-20 DIAGNOSIS — T7840XA Allergy, unspecified, initial encounter: Principal | ICD-10-CM

## 2024-05-20 DIAGNOSIS — Z8614 Personal history of Methicillin resistant Staphylococcus aureus infection: Secondary | ICD-10-CM

## 2024-05-20 DIAGNOSIS — L03113 Cellulitis of right upper limb: Secondary | ICD-10-CM | POA: Diagnosis present

## 2024-05-20 DIAGNOSIS — A4902 Methicillin resistant Staphylococcus aureus infection, unspecified site: Secondary | ICD-10-CM | POA: Diagnosis present

## 2024-05-20 DIAGNOSIS — Q796 Ehlers-Danlos syndrome, unspecified: Secondary | ICD-10-CM

## 2024-05-20 DIAGNOSIS — F17200 Nicotine dependence, unspecified, uncomplicated: Secondary | ICD-10-CM | POA: Diagnosis present

## 2024-05-20 DIAGNOSIS — R238 Other skin changes: Secondary | ICD-10-CM | POA: Diagnosis present

## 2024-05-20 DIAGNOSIS — F1911 Other psychoactive substance abuse, in remission: Secondary | ICD-10-CM | POA: Diagnosis present

## 2024-05-20 DIAGNOSIS — Z79899 Other long term (current) drug therapy: Secondary | ICD-10-CM

## 2024-05-20 LAB — CBC WITH DIFFERENTIAL/PLATELET
Abs Immature Granulocytes: 0.04 10*3/uL (ref 0.00–0.07)
Basophils Absolute: 0 10*3/uL (ref 0.0–0.1)
Basophils Relative: 0 %
Eosinophils Absolute: 0.4 10*3/uL (ref 0.0–0.5)
Eosinophils Relative: 5 %
HCT: 32.8 % — ABNORMAL LOW (ref 36.0–46.0)
Hemoglobin: 10.6 g/dL — ABNORMAL LOW (ref 12.0–15.0)
Immature Granulocytes: 1 %
Lymphocytes Relative: 20 %
Lymphs Abs: 1.4 10*3/uL (ref 0.7–4.0)
MCH: 30.3 pg (ref 26.0–34.0)
MCHC: 32.3 g/dL (ref 30.0–36.0)
MCV: 93.7 fL (ref 80.0–100.0)
Monocytes Absolute: 0.3 10*3/uL (ref 0.1–1.0)
Monocytes Relative: 4 %
Neutro Abs: 5 10*3/uL (ref 1.7–7.7)
Neutrophils Relative %: 70 %
Platelets: 248 10*3/uL (ref 150–400)
RBC: 3.5 MIL/uL — ABNORMAL LOW (ref 3.87–5.11)
RDW: 13.8 % (ref 11.5–15.5)
WBC: 7.1 10*3/uL (ref 4.0–10.5)
nRBC: 0 % (ref 0.0–0.2)

## 2024-05-20 LAB — COMPREHENSIVE METABOLIC PANEL WITH GFR
ALT: 74 U/L — ABNORMAL HIGH (ref 0–44)
AST: 72 U/L — ABNORMAL HIGH (ref 15–41)
Albumin: 3.4 g/dL — ABNORMAL LOW (ref 3.5–5.0)
Alkaline Phosphatase: 64 U/L (ref 38–126)
Anion gap: 11 (ref 5–15)
BUN: 17 mg/dL (ref 6–20)
CO2: 22 mmol/L (ref 22–32)
Calcium: 8.8 mg/dL — ABNORMAL LOW (ref 8.9–10.3)
Chloride: 102 mmol/L (ref 98–111)
Creatinine, Ser: 0.77 mg/dL (ref 0.44–1.00)
GFR, Estimated: >60 mL/min (ref >60)
Glucose, Bld: 105 mg/dL — ABNORMAL HIGH (ref 70–99)
Potassium: 4.2 mmol/L (ref 3.5–5.1)
Sodium: 135 mmol/L (ref 135–145)
Total Bilirubin: 1 mg/dL (ref 0.0–1.2)
Total Protein: 7.7 g/dL (ref 6.5–8.1)

## 2024-05-20 LAB — SEDIMENTATION RATE: Sed Rate: 39 mm/h — ABNORMAL HIGH (ref 0–22)

## 2024-05-20 LAB — C-REACTIVE PROTEIN: CRP: 12 mg/dL — ABNORMAL HIGH (ref <1.0)

## 2024-05-20 MED ORDER — ONDANSETRON HCL 4 MG/2ML IJ SOLN
4.0000 mg | Freq: Four times a day (QID) | INTRAMUSCULAR | Status: DC | PRN
  Administered 2024-05-21 (×2): 4 mg via INTRAVENOUS
  Filled 2024-05-20 (×2): qty 2

## 2024-05-20 MED ORDER — MELATONIN 5 MG PO TABS
5.0000 mg | ORAL_TABLET | Freq: Every evening | ORAL | Status: DC | PRN
  Filled 2024-05-20: qty 1

## 2024-05-20 MED ORDER — FENTANYL CITRATE PF 50 MCG/ML IJ SOSY
50.0000 ug | PREFILLED_SYRINGE | Freq: Once | INTRAMUSCULAR | Status: AC
  Administered 2024-05-20: 50 ug via INTRAVENOUS
  Filled 2024-05-20: qty 1

## 2024-05-20 MED ORDER — METHYLPREDNISOLONE SODIUM SUCC 125 MG IJ SOLR
125.0000 mg | Freq: Once | INTRAMUSCULAR | Status: AC
  Administered 2024-05-20: 125 mg via INTRAVENOUS
  Filled 2024-05-20: qty 2

## 2024-05-20 MED ORDER — DIPHENHYDRAMINE HCL 50 MG/ML IJ SOLN
12.5000 mg | Freq: Four times a day (QID) | INTRAMUSCULAR | Status: DC | PRN
  Administered 2024-05-21 – 2024-05-22 (×5): 12.5 mg via INTRAVENOUS
  Filled 2024-05-20 (×5): qty 1

## 2024-05-20 MED ORDER — METHYLPREDNISOLONE SODIUM SUCC 40 MG IJ SOLR
40.0000 mg | Freq: Two times a day (BID) | INTRAMUSCULAR | Status: DC
  Administered 2024-05-20 – 2024-05-22 (×4): 40 mg via INTRAVENOUS
  Filled 2024-05-20 (×5): qty 1

## 2024-05-20 MED ORDER — MORPHINE SULFATE (PF) 2 MG/ML IV SOLN
2.0000 mg | INTRAVENOUS | Status: DC | PRN
  Administered 2024-05-21 – 2024-05-22 (×5): 2 mg via INTRAVENOUS
  Filled 2024-05-20 (×6): qty 1

## 2024-05-20 MED ORDER — FAMOTIDINE 20 MG PO TABS
20.0000 mg | ORAL_TABLET | Freq: Two times a day (BID) | ORAL | Status: DC
  Administered 2024-05-20 – 2024-05-22 (×4): 20 mg via ORAL
  Filled 2024-05-20 (×4): qty 1

## 2024-05-20 MED ORDER — POLYETHYLENE GLYCOL 3350 17 G PO PACK: 17.0000 g | PACK | Freq: Every day | ORAL | Status: DC | PRN

## 2024-05-20 MED ORDER — SODIUM CHLORIDE 0.9 % IV SOLN: INTRAVENOUS | Status: AC

## 2024-05-20 MED ORDER — OXYCODONE HCL 5 MG PO TABS
5.0000 mg | ORAL_TABLET | Freq: Four times a day (QID) | ORAL | Status: DC | PRN
  Administered 2024-05-22: 5 mg via ORAL
  Filled 2024-05-20 (×2): qty 1

## 2024-05-20 MED ORDER — FAMOTIDINE IN NACL 20-0.9 MG/50ML-% IV SOLN
20.0000 mg | Freq: Once | INTRAVENOUS | Status: AC
  Administered 2024-05-20: 20 mg via INTRAVENOUS
  Filled 2024-05-20: qty 50

## 2024-05-20 MED ORDER — DIPHENHYDRAMINE HCL 50 MG/ML IJ SOLN
25.0000 mg | Freq: Once | INTRAMUSCULAR | Status: AC
  Administered 2024-05-20: 25 mg via INTRAVENOUS
  Filled 2024-05-20: qty 1

## 2024-05-20 MED ORDER — ONDANSETRON HCL 4 MG/2ML IJ SOLN
4.0000 mg | Freq: Once | INTRAMUSCULAR | Status: AC
  Administered 2024-05-20: 4 mg via INTRAVENOUS
  Filled 2024-05-20: qty 2

## 2024-05-20 MED ORDER — HYDROXYZINE HCL 25 MG PO TABS
25.0000 mg | ORAL_TABLET | Freq: Once | ORAL | Status: AC
  Administered 2024-05-20: 25 mg via ORAL
  Filled 2024-05-20: qty 1

## 2024-05-20 MED ORDER — ENOXAPARIN SODIUM 40 MG/0.4ML IJ SOSY
40.0000 mg | PREFILLED_SYRINGE | INTRAMUSCULAR | Status: DC
  Administered 2024-05-21 (×2): 40 mg via SUBCUTANEOUS
  Filled 2024-05-20 (×2): qty 0.4

## 2024-05-20 MED ORDER — ACETAMINOPHEN 325 MG PO TABS: 650.0000 mg | ORAL_TABLET | Freq: Four times a day (QID) | ORAL | Status: DC | PRN

## 2024-05-20 MED ORDER — ONDANSETRON HCL 4 MG PO TABS: 4.0000 mg | ORAL_TABLET | Freq: Four times a day (QID) | ORAL | Status: DC | PRN

## 2024-05-20 NOTE — H&P (Signed)
 History and Physical    Patient: Sandra Atkinson FMW:[id number] DOB: 06/15/1971 DOA: 05/20/2024 DOS: the patient was seen and examined on 05/20/2024 PCP: Catalina Bare, MD  Patient coming from: Home  Chief Complaint:  Chief Complaint  Patient presents with   Allergic Reaction    Swelling/ redness   HPI: Sandra Atkinson is a 53 y.o. female with medical history significant of polysubstance abuse, GERD, essential hypertension, history of recent MRSA infection, PTSD who recently had a dog bite and was admitted at [facility] for about a week on vancomycin.  Cultures grew MRSA.  Patient ultimately was given dalbavancin and discharged few days ago.  Patient came to the hospital to visit her fianc who is on admission here.  Since yesterday she has noted this diffuse rashes that are itchy.  Patient said she has been having no fevers.  But she has noticed some blisters arising from the rashes on her legs.  Patient has no prior history of drug allergies.  She denied taking any new medications.  She does have pitting edema with the blisters.  She also have some pain associated with it.  Patient seen and evaluated in the ER.  Appears to be having either some autoimmune reaction or p.o. drug reactions.  She has been admitted to the hospital for evaluation and treatment.  Review of Systems: As mentioned in the history of present illness. All other systems reviewed and are negative. Past Medical History:  Diagnosis Date   Fibrodysplasia elastica generalisata    Hysterectomy   Lung nodule    Past Surgical History:  Procedure Laterality Date   ABDOMINAL HYSTERECTOMY     ANTERIOR CERVICAL DECOMP/DISCECTOMY FUSION  02/05/2012   Procedure: ANTERIOR CERVICAL DECOMPRESSION/DISCECTOMY FUSION 1 LEVEL;  Surgeon: Victory Gens, MD;  Location: [facility];  Service: Neurosurgery;  Laterality: N/A;  Cervical Five-Six Anterior cervical decompression/diskectomy, fusion   APPENDECTOMY     LIVER SURGERY      Social History:  reports that she has been smoking. She has a 10 pack-year smoking history. She has never used smokeless tobacco. She reports that she does not drink alcohol and does not use drugs.  Allergies  Allergen Reactions   Ibuprofen  Hives    Family History  Problem Relation Age of Onset   Cancer Father    Anesthesia problems Neg Hx     Prior to Admission medications   Medication Sig Start Date End Date Taking? Authorizing Provider  amoxicillin -clavulanate (AUGMENTIN ) 875-125 MG tablet Take 1 tablet by mouth every 12 (twelve) hours. 04/06/24   Randol Simmonds, MD  amphetamine-dextroamphetamine (ADDERALL) 15 MG tablet Take 15 mg by mouth 3 (three) times daily.    [provider]  benzocaine  (ORABASE-B) 20 % PSTE Use as directed 1 application in the mouth or throat 4 (four) times daily as needed for mouth pain. 02/25/17   Norris Will PARAS, PA-C  divalproex (DEPAKOTE) 500 MG DR tablet Take 500 mg by mouth 3 (three) times daily.     [provider]  escitalopram (LEXAPRO) 20 MG tablet Take 20 mg by mouth at bedtime.     [provider]  gabapentin (NEURONTIN) 100 MG capsule Take 100 mg by mouth 3 (three) times daily. Nerve pain    [provider]  metoprolol succinate (TOPROL-XL) 25 MG 24 hr tablet Take 25 mg by mouth daily.    [provider]  oxymorphone (OPANA ER) 5 MG 12 hr tablet Take 5 mg by mouth 2 (two) times  daily as needed for pain.    [provider]  oxymorphone (OPANA) 10 MG tablet Take 10 mg by mouth 3 (three) times daily. scheduled    [provider]  pravastatin (PRAVACHOL) 20 MG tablet Take 20 mg by mouth at bedtime.    [provider]  prazosin (MINIPRESS) 2 MG capsule Take 4 mg by mouth at bedtime.    [provider]  tiZANidine (ZANAFLEX) 4 MG tablet Take 4 mg by mouth 3 (three) times daily. scheduled    [provider]  traZODone (DESYREL) 150 MG tablet Take 150 mg by mouth at  bedtime.    [provider]    Physical Exam: Vitals:   05/20/24 1800 05/20/24 1815 05/20/24 1821 05/20/24 1830  BP: 120/71 125/73  114/69  Pulse:      Resp: 20 20  19   Temp:   99 F (37.2 C)   TempSrc:      SpO2:      Weight:      Height:       Constitutional: Acutely ill looking, NAD, calm, comfortable Eyes: PERRL, lids and conjunctivae normal ENMT: Mucous membranes are moist. Posterior pharynx clear of any exudate or lesions.Normal dentition.  Neck: normal, supple, no masses, no thyromegaly Respiratory: clear to auscultation bilaterally, no wheezing, no crackles. Normal respiratory effort. No accessory muscle use.  Cardiovascular: Regular rate and rhythm, no murmurs / rubs / gallops.  1+ extremity edema. 2+ pedal pulses. No carotid bruits.  Abdomen: no tenderness, no masses palpated. No hepatosplenomegaly. Bowel sounds positive.  Musculoskeletal: Good range of motion, no joint swelling or tenderness, Skin: Diffuse erythematous rashes covering the lower extremity, upper extremity, and trunk.  Sparing of the buccal mucosa.  Distinct bullae in the dorsum of the feet bilaterally, no open ulcers Neurologic: CN 2-12 grossly intact. Sensation intact, DTR normal. Strength 5/5 in all 4.  Psychiatric: Normal judgment and insight. Alert and oriented x 3. Normal mood  Data Reviewed:  Temperature 97.4, blood pressure 154/90, hemoglobin 10.6, calcium 8.8,  Assessment and Plan:  #1 suspected drug reaction: Patient has diffuse total body reaction.  Seems to to be some delayed drug reaction.  Possibly to the vancomycin or dalbavancin.  It could also be some autoimmune causes but the sudden nature makes it more likely to be a reaction.  Patient will be admitted.  Initiate treatment with IV Solu-Medrol, Pepcid as well as Benadryl.  Will also check markers for autoimmune disease.  Patient may require dermatology consultation if no improvement within the next 24 hours on the current  regimen.  #2 recent dog bite: Patient has had a wound from recent dog bites.  She has cellulitis that was treated.  It appears to be MRSA.  Wound care will continue.  Both hands are currently dressed and wrapped.  #3 MRSA cellulitis of the right hand: Patient has been treated with 7 days of vancomycin and then dalbavancin.  We will watch no treatment at the moment.  #4 polysubstance abuse: Patient denied active use of drugs.  Will check drug screen.  #5 essential hypertension: Will confirm and resume home regimen.  #6 PTSD history: Confirm and resume home regimen  #7 GERD: Continue with PPIs    Advance Care Planning:   Code Status: Full Code   Consults: None but may require dermatology consult  Family Communication: Husband is in the hospital  Severity of Illness: The appropriate patient status for this patient is INPATIENT. Inpatient status is judged to be  reasonable and necessary in order to provide the required intensity of service to ensure the patient's safety. The patient's presenting symptoms, physical exam findings, and initial radiographic and laboratory data in the context of their chronic comorbidities is felt to place them at high risk for further clinical deterioration. Furthermore, it is not anticipated that the patient will be medically stable for discharge from the hospital within 2 midnights of admission.   * I certify that at the point of admission it is my clinical judgment that the patient will require inpatient hospital care spanning beyond 2 midnights from the point of admission due to high intensity of service, high risk for further deterioration and high frequency of surveillance required.*  AuthorBETHA SIM KNOLL, MD 05/20/2024 6:54 PM  For on call review [email or web address].

## 2024-05-20 NOTE — ED Provider Triage Note (Signed)
 Emergency Medicine Provider Triage Evaluation Note  Sandra Atkinson , a 53 y.o. female  was evaluated in triage.  Pt complains of allergic reaction.  Ports been hospitalized recently received infusion of vancomycin, given 1 round of Dalvance, reports after her infusion she began to have some itching in her legs, now these are red, swollen, has developed blisters to top of her feet. No systemic signs  Review of Systems  Positive: Leg swelling, erythema Negative: Fever, sob, cp  Physical Exam  BP (!) 134/97   Pulse 81   Temp 97.9 F (36.6 C)   Resp 18   LMP 09/26/2010   SpO2 100%  Gen:   Awake, no distress   Resp:  Normal effort  MSK:   Moves extremities without difficulty  Other:  Significant erythema, blistering to the dorsum of BL feet.  Medical Decision Making  Medically screening exam initiated at 1:56 PM.  Appropriate orders placed.  Sandra Atkinson was informed that the remainder of the evaluation will be completed by another provider, this initial triage assessment does not replace that evaluation, and the importance of remaining in the ED until their evaluation is complete.     Rodolfo Notaro, PA-C 05/20/24 1401

## 2024-05-20 NOTE — ED Triage Notes (Signed)
 Pt had dalvance infusion on the 23rd and yesterday developed extremity redness and swelling. Blisters on feet and petechiae on legs. Was told by doctor to come in. No trouble swallowing, no sob, no cp

## 2024-05-20 NOTE — Progress Notes (Signed)
 NEW ADMISSION NOTE   Arrival Method: ED stretcher Mental Orientation: AAOx4 Telemetry: 5M16 Assessment: Completed Skin: See flowsheet IV: LFA Pain: 0/10 Tubes: n/a Safety Measures: Safety Fall Prevention Plan has been given, discussed and signed Admission: Completed [facility] Orientation: Patient has been orientated to the room, unit and staff.  Family: none at bedside   Orders have been reviewed and implemented. Will continue to monitor the patient. Call light has been placed within reach and bed alarm has been activated.

## 2024-05-20 NOTE — ED Provider Notes (Signed)
 [place] EMERGENCY DEPARTMENT AT [facility] Provider Note   CSN: [id number] Arrival date & time: 05/20/24  1329     Patient presents with: Allergic Reaction (Swelling/ redness)   Sandra Atkinson is a 53 y.o. female who presents for Severe Adverse Skin Reaction. She reports that she was admitted at [facility] for dog bite to the arms from a pit bull.  They became infected.  She was in the hospital for 1 week on vancomycin kept over the weekend and states that Monday, the 23rd she was given an infusion of dalbavancin and discharged.  She came directly here to [facility] because her husband is currently on the fifth floor and has been here since.  She reports that she felt really bad the evening of discharge and had shaking chills all over.  The next day she developed skin pain.  That evening she took a shower and developed itching all over her body.  Her husband noticed that her skin was extremely red.  Overnight she developed blisters over her feet, pitting edema of her lower extremities, severe pain in the skin on her legs, itching and swelling of her upper extremities.  She denies any mucosal involvement.  She denies fevers.    Allergic Reaction      Prior to Admission medications   Medication Sig Start Date End Date Taking? Authorizing Provider  amoxicillin -clavulanate (AUGMENTIN ) 875-125 MG tablet Take 1 tablet by mouth every 12 (twelve) hours. 04/06/24   Randol Simmonds, MD  amphetamine-dextroamphetamine (ADDERALL) 15 MG tablet Take 15 mg by mouth 3 (three) times daily.    [provider]  benzocaine  (ORABASE-B) 20 % PSTE Use as directed 1 application in the mouth or throat 4 (four) times daily as needed for mouth pain. 02/25/17   Norris Will PARAS, PA-C  divalproex (DEPAKOTE) 500 MG DR tablet Take 500 mg by mouth 3 (three) times daily.     [provider]  escitalopram (LEXAPRO) 20 MG tablet Take 20 mg by mouth at bedtime.     [provider]   gabapentin (NEURONTIN) 100 MG capsule Take 100 mg by mouth 3 (three) times daily. Nerve pain    [provider]  metoprolol succinate (TOPROL-XL) 25 MG 24 hr tablet Take 25 mg by mouth daily.    [provider]  oxymorphone (OPANA ER) 5 MG 12 hr tablet Take 5 mg by mouth 2 (two) times daily as needed for pain.    [provider]  oxymorphone (OPANA) 10 MG tablet Take 10 mg by mouth 3 (three) times daily. scheduled    [provider]  pravastatin (PRAVACHOL) 20 MG tablet Take 20 mg by mouth at bedtime.    [provider]  prazosin (MINIPRESS) 2 MG capsule Take 4 mg by mouth at bedtime.    [provider]  tiZANidine (ZANAFLEX) 4 MG tablet Take 4 mg by mouth 3 (three) times daily. scheduled    [provider]  traZODone (DESYREL) 150 MG tablet Take 150 mg by mouth at bedtime.    [provider]    Allergies: Ibuprofen     Review of Systems  Updated Vital Signs BP (!) 154/90 (BP Location: Right Arm)   Pulse 78   Temp (!) 97.4 F (36.3 C) (Oral)   Resp 15   Ht 5' 8 (1.727 m)   Wt 65.8 kg   LMP 09/26/2010   SpO2 100%   BMI 22.05 kg/m   Physical Exam Vitals and nursing  note reviewed.  Constitutional:      General: She is not in acute distress.    Appearance: She is well-developed. She is not diaphoretic.  HENT:     Head: Normocephalic and atraumatic.     Right Ear: External ear normal.     Left Ear: External ear normal.     Nose: Nose normal.     Mouth/Throat:     Mouth: Mucous membranes are moist.   Eyes:     General: No scleral icterus.    Conjunctiva/sclera: Conjunctivae normal.    Cardiovascular:     Rate and Rhythm: Normal rate and regular rhythm.     Heart sounds: Normal heart sounds. No murmur heard.    No friction rub. No gallop.  Pulmonary:     Effort: Pulmonary effort is normal. No respiratory distress.     Breath sounds: Normal breath sounds.  Abdominal:     General: Bowel sounds are  normal. There is no distension.     Palpations: Abdomen is soft. There is no mass.     Tenderness: There is no abdominal tenderness. There is no guarding.   Musculoskeletal:     Cervical back: Normal range of motion.     Right lower leg: Edema present.     Left lower leg: Edema present.   Skin:    General: Skin is warm and dry.     Findings: Erythema present.     Comments: Blisters over the toes   Neurological:     Mental Status: She is alert and oriented to person, place, and time.   Psychiatric:        Behavior: Behavior normal.     (all labs ordered are listed, but only abnormal results are displayed) Labs Reviewed  CBC WITH DIFFERENTIAL/PLATELET - Abnormal; Notable for the following components:      Result Value   RBC 3.50 (*)    Hemoglobin 10.6 (*)    HCT 32.8 (*)    All other components within normal limits  COMPREHENSIVE METABOLIC PANEL WITH GFR - Abnormal; Notable for the following components:   Glucose, Bld 105 (*)    Calcium 8.8 (*)    Albumin 3.4 (*)    AST 72 (*)    ALT 74 (*)    All other components within normal limits    EKG: None  Radiology: No results found.   Procedures   Medications Ordered in the ED - No data to display                                  Medical Decision Making Amount and/or Complexity of Data Reviewed Labs: ordered.  Risk Prescription drug management. Decision regarding hospitalization.   Patient here with sever drug eruption of slin. Ddx includes SJS, TENS, Generalized bullous fixed drug eruption .No desquamation or mucosal involvement.  Case discussed with Dr. Sim who will admit the patient.  I reviewed the patient's labs.  No significant leukocytosis.  She has elevated liver enzymes which appear to be chronic.      Final diagnoses:  None    ED Discharge Orders     None          Arloa Chroman, PA-C 05/20/24 2324    Elnor Jayson LABOR, DO 05/23/24 [phone]

## 2024-05-20 NOTE — ED Notes (Signed)
 CCMD called. Pt placed on monitor.

## 2024-05-21 DIAGNOSIS — T50905A Adverse effect of unspecified drugs, medicaments and biological substances, initial encounter: Secondary | ICD-10-CM | POA: Diagnosis not present

## 2024-05-21 DIAGNOSIS — I10 Essential (primary) hypertension: Secondary | ICD-10-CM | POA: Diagnosis present

## 2024-05-21 DIAGNOSIS — L03113 Cellulitis of right upper limb: Secondary | ICD-10-CM | POA: Diagnosis present

## 2024-05-21 DIAGNOSIS — Q796 Ehlers-Danlos syndrome, unspecified: Secondary | ICD-10-CM | POA: Diagnosis not present

## 2024-05-21 DIAGNOSIS — F1519 Other stimulant abuse with unspecified stimulant-induced disorder: Secondary | ICD-10-CM | POA: Diagnosis present

## 2024-05-21 DIAGNOSIS — F17200 Nicotine dependence, unspecified, uncomplicated: Secondary | ICD-10-CM | POA: Diagnosis present

## 2024-05-21 DIAGNOSIS — L270 Generalized skin eruption due to drugs and medicaments taken internally: Principal | ICD-10-CM

## 2024-05-21 DIAGNOSIS — F4310 Post-traumatic stress disorder, unspecified: Secondary | ICD-10-CM | POA: Diagnosis present

## 2024-05-21 DIAGNOSIS — Z90710 Acquired absence of both cervix and uterus: Secondary | ICD-10-CM | POA: Diagnosis not present

## 2024-05-21 DIAGNOSIS — T50995A Adverse effect of other drugs, medicaments and biological substances, initial encounter: Secondary | ICD-10-CM | POA: Diagnosis present

## 2024-05-21 DIAGNOSIS — R238 Other skin changes: Secondary | ICD-10-CM | POA: Diagnosis present

## 2024-05-21 DIAGNOSIS — Z79899 Other long term (current) drug therapy: Secondary | ICD-10-CM | POA: Diagnosis not present

## 2024-05-21 DIAGNOSIS — W540XXS Bitten by dog, sequela: Secondary | ICD-10-CM | POA: Diagnosis not present

## 2024-05-21 DIAGNOSIS — S61451S Open bite of right hand, sequela: Secondary | ICD-10-CM | POA: Diagnosis not present

## 2024-05-21 DIAGNOSIS — A4902 Methicillin resistant Staphylococcus aureus infection, unspecified site: Secondary | ICD-10-CM

## 2024-05-21 DIAGNOSIS — F1210 Cannabis abuse, uncomplicated: Secondary | ICD-10-CM | POA: Diagnosis present

## 2024-05-21 DIAGNOSIS — K219 Gastro-esophageal reflux disease without esophagitis: Secondary | ICD-10-CM | POA: Diagnosis present

## 2024-05-21 DIAGNOSIS — Z8614 Personal history of Methicillin resistant Staphylococcus aureus infection: Secondary | ICD-10-CM | POA: Diagnosis not present

## 2024-05-21 LAB — COMPREHENSIVE METABOLIC PANEL WITH GFR
ALT: 65 U/L — ABNORMAL HIGH (ref 0–44)
AST: 47 U/L — ABNORMAL HIGH (ref 15–41)
Albumin: 3.1 g/dL — ABNORMAL LOW (ref 3.5–5.0)
Alkaline Phosphatase: 63 U/L (ref 38–126)
Anion gap: 6 (ref 5–15)
BUN: 13 mg/dL (ref 6–20)
CO2: 25 mmol/L (ref 22–32)
Calcium: 8.6 mg/dL — ABNORMAL LOW (ref 8.9–10.3)
Chloride: 105 mmol/L (ref 98–111)
Creatinine, Ser: 0.63 mg/dL (ref 0.44–1.00)
GFR, Estimated: >60 mL/min (ref >60)
Glucose, Bld: 153 mg/dL — ABNORMAL HIGH (ref 70–99)
Potassium: 3.8 mmol/L (ref 3.5–5.1)
Sodium: 136 mmol/L (ref 135–145)
Total Bilirubin: 0.6 mg/dL (ref 0.0–1.2)
Total Protein: 7.6 g/dL (ref 6.5–8.1)

## 2024-05-21 LAB — CBC
HCT: 32.3 % — ABNORMAL LOW (ref 36.0–46.0)
Hemoglobin: 10.5 g/dL — ABNORMAL LOW (ref 12.0–15.0)
MCH: 29.7 pg (ref 26.0–34.0)
MCHC: 32.5 g/dL (ref 30.0–36.0)
MCV: 91.2 fL (ref 80.0–100.0)
Platelets: 237 10*3/uL (ref 150–400)
RBC: 3.54 MIL/uL — ABNORMAL LOW (ref 3.87–5.11)
RDW: 13.4 % (ref 11.5–15.5)
WBC: 8 10*3/uL (ref 4.0–10.5)
nRBC: 0 % (ref 0.0–0.2)

## 2024-05-21 LAB — RAPID URINE DRUG SCREEN, HOSP PERFORMED
Amphetamines: POSITIVE — AB
Barbiturates: NOT DETECTED
Benzodiazepines: NOT DETECTED
Cocaine: NOT DETECTED
Opiates: POSITIVE — AB
Tetrahydrocannabinol: NOT DETECTED

## 2024-05-21 LAB — HIV ANTIBODY (ROUTINE TESTING W REFLEX): HIV Screen 4th Generation wRfx: NONREACTIVE

## 2024-05-21 LAB — MRSA NEXT GEN BY PCR, NASAL: MRSA by PCR Next Gen: NOT DETECTED

## 2024-05-21 MED ORDER — CAMPHOR-MENTHOL 0.5-0.5 % EX LOTN
TOPICAL_LOTION | CUTANEOUS | Status: DC | PRN
  Filled 2024-05-21: qty 222

## 2024-05-21 NOTE — Plan of Care (Signed)

## 2024-05-21 NOTE — Progress Notes (Signed)
 Progress Note   Patient: Sandra Atkinson FMW:[id number] DOB: August 26, 1971 DOA: 05/20/2024  DOS: the patient was seen and examined on 05/21/2024   Brief hospital course:  53 y.o. female with medical history significant of polysubstance abuse, GERD, essential hypertension, history of recent MRSA infection, PTSD who recently had a dog bite and was admitted at [facility] for about a week on vancomycin.  Cultures grew MRSA.  Patient ultimately was given dalbavancin and discharged few days ago.  Patient came to the hospital to visit her fianc who is on admission here.  Since yesterday she has noted this diffuse rashes that are itchy.   Assessment and Plan:  Suspected drug reaction - Likely delayed reaction to dalbavancin.  Other etiologies include polysubstance use, amphetamines.  Diffuse body and extremity erythema with some vesicles/bulla noted in the feet.  No purulence, sloughing, pain at this time, only itching.  Continue IV Solu-Medrol, Pepcid, Benadryl.  IV fluids on board.  Recent MRSA cellulitis of right hand - Was treated with 7 days of vancomycin followed by dalbavancin.  Likely etiology of patient's diffuse erythema reaction above.  No fevers nor leukocytosis.  Will hold off on antibiotics at this time.  Polysubstance abuse - Denied active use of drugs.  Tox screen positive for amphetamines and opioids however.  PTSD - Resume home regimen.  Hypertension - Resume home regimen  Subjective: Patient resting comfortably in bed this morning.  States she feels a bit better but still has diffuse itching lower extremities.  Notes she has some small bulla in the dorsal aspect of both feet.  Denies any mucosal involvement.  No pain, bleeding, purulence.  Physical Exam:  Vitals:   05/20/24 1955 05/20/24 2029 05/21/24 0607 05/21/24 0800  BP: 116/72 124/78 116/69 117/61  Pulse: 71 90 73 96  Resp: 18 18 18 18   Temp: 98.4 F (36.9 C) 98.2 F (36.8 C) 97.8 F (36.6 C) 98.4 F  (36.9 C)  TempSrc:  Oral Oral Axillary  SpO2: 98% 98% 99% 97%  Weight:      Height:        GENERAL:  Alert, pleasant, no acute distress  HEENT:  EOMI CARDIOVASCULAR:  RRR, no murmurs appreciated RESPIRATORY:  Clear to auscultation, no wheezing, rales, or rhonchi GASTROINTESTINAL:  Soft, nontender, nondistended EXTREMITIES:  No LE edema bilaterally NEURO:  No new focal deficits appreciated SKIN: Diffuse erythema in all extremities, lower extremities worse than upper.  Pruritic.  Dorsal left foot has intact 4 cm bulla.   dorsal right foot is intact 2 cm bulla. PSYCH:  Appropriate mood and affect     Data Reviewed:  No new imaging to review at this time.  Previous records (including but not limited to H&P, progress notes, nursing notes, TOC management) were reviewed in assessment of this patient.  Labs: CBC: Recent Labs  Lab 05/20/24 1402 05/21/24 0531  WBC 7.1 8.0  NEUTROABS 5.0  --   HGB 10.6* 10.5*  HCT 32.8* 32.3*  MCV 93.7 91.2  PLT 248 237   Basic Metabolic Panel: Recent Labs  Lab 05/20/24 1402 05/21/24 0531  NA 135 136  K 4.2 3.8  CL 102 105  CO2 22 25  GLUCOSE 105* 153*  BUN 17 13  CREATININE 0.77 0.63  CALCIUM 8.8* 8.6*   Liver Function Tests: Recent Labs  Lab 05/20/24 1402 05/21/24 0531  AST 72* 47*  ALT 74* 65*  ALKPHOS 64 63  BILITOT 1.0 0.6  PROT 7.7 7.6  ALBUMIN 3.4* 3.1*  CBG: No results for input(s): GLUCAP in the last 168 hours.  Scheduled Meds:  enoxaparin (LOVENOX) injection  40 mg Subcutaneous Q24H   famotidine  20 mg Oral BID   methylPREDNISolone (SOLU-MEDROL) injection  40 mg Intravenous Q12H   Continuous Infusions:  sodium chloride  40 mL/hr at 05/20/24 2210   PRN Meds:.acetaminophen , diphenhydrAMINE, melatonin, morphine  injection, ondansetron  **OR** ondansetron  (ZOFRAN ) IV, oxyCODONE , polyethylene glycol  Family Communication: None at bedside  Disposition: Status is: Observation The patient will require care  spanning > 2 midnights and should be moved to inpatient because: Drug reaction rash with bulla requiring IV fluids and IV steroids.     Time spent: 35 minutes  Length of inpatient stay: 0 days  Author: Carliss LELON Canales, DO 05/21/2024 11:46 AM  For on call review [email or web address].

## 2024-05-21 NOTE — Hospital Course (Signed)
 53 y.o. female with medical history significant of polysubstance abuse, GERD, essential hypertension, history of recent MRSA infection, PTSD who recently had a dog bite and was admitted at [facility] for about a week on vancomycin.  Cultures grew MRSA.  Patient ultimately was given dalbavancin and discharged few days ago.  Patient came to the hospital to visit her fianc who is on admission here.  Since yesterday she has noted this diffuse rashes that are itchy.    Assessment and Plan:   Suspected drug reaction - Likely delayed reaction to dalbavancin.  Other etiologies include polysubstance use, amphetamines.  Diffuse body and extremity erythema with some vesicles/bulla noted in the feet.  No purulence, sloughing, pain at this time, only itching.  Continue IV Solu-Medrol, Pepcid, Benadryl.  IV fluids on board.   Recent MRSA cellulitis of right hand - Was treated with 7 days of vancomycin followed by dalbavancin.  Likely etiology of patient's diffuse erythema reaction above.  No fevers nor leukocytosis.  Will hold off on antibiotics at this time.   Polysubstance abuse - Denied active use of drugs.  Tox screen positive for amphetamines and opioids however.   PTSD - Resume home regimen.   Hypertension - Resume home regimen

## 2024-05-22 ENCOUNTER — Encounter (HOSPITAL_COMMUNITY): Payer: Self-pay | Admitting: Internal Medicine

## 2024-05-22 ENCOUNTER — Other Ambulatory Visit (HOSPITAL_COMMUNITY): Payer: Self-pay

## 2024-05-22 DIAGNOSIS — T50905A Adverse effect of unspecified drugs, medicaments and biological substances, initial encounter: Secondary | ICD-10-CM | POA: Diagnosis not present

## 2024-05-22 DIAGNOSIS — F1911 Other psychoactive substance abuse, in remission: Secondary | ICD-10-CM

## 2024-05-22 DIAGNOSIS — L270 Generalized skin eruption due to drugs and medicaments taken internally: Secondary | ICD-10-CM | POA: Diagnosis not present

## 2024-05-22 DIAGNOSIS — S61451S Open bite of right hand, sequela: Secondary | ICD-10-CM | POA: Diagnosis not present

## 2024-05-22 DIAGNOSIS — I10 Essential (primary) hypertension: Secondary | ICD-10-CM | POA: Diagnosis not present

## 2024-05-22 LAB — BASIC METABOLIC PANEL WITH GFR
Anion gap: 10 (ref 5–15)
BUN: 19 mg/dL (ref 6–20)
CO2: 24 mmol/L (ref 22–32)
Calcium: 8.9 mg/dL (ref 8.9–10.3)
Chloride: 106 mmol/L (ref 98–111)
Creatinine, Ser: 0.72 mg/dL (ref 0.44–1.00)
GFR, Estimated: >60 mL/min (ref >60)
Glucose, Bld: 163 mg/dL — ABNORMAL HIGH (ref 70–99)
Potassium: 3.3 mmol/L — ABNORMAL LOW (ref 3.5–5.1)
Sodium: 140 mmol/L (ref 135–145)

## 2024-05-22 LAB — CBC
HCT: 32.8 % — ABNORMAL LOW (ref 36.0–46.0)
Hemoglobin: 10.5 g/dL — ABNORMAL LOW (ref 12.0–15.0)
MCH: 29.8 pg (ref 26.0–34.0)
MCHC: 32 g/dL (ref 30.0–36.0)
MCV: 93.2 fL (ref 80.0–100.0)
Platelets: 264 10*3/uL (ref 150–400)
RBC: 3.52 MIL/uL — ABNORMAL LOW (ref 3.87–5.11)
RDW: 13.7 % (ref 11.5–15.5)
WBC: 13.6 10*3/uL — ABNORMAL HIGH (ref 4.0–10.5)
nRBC: 0 % (ref 0.0–0.2)

## 2024-05-22 LAB — ANA W/REFLEX IF POSITIVE: Anti Nuclear Antibody (ANA): NEGATIVE

## 2024-05-22 MED ORDER — FAMOTIDINE 20 MG PO TABS
20.0000 mg | ORAL_TABLET | Freq: Two times a day (BID) | ORAL | 0 refills | Status: AC
  Filled 2024-05-22: qty 8, 4d supply, fill #0

## 2024-05-22 MED ORDER — DIPHENHYDRAMINE-ZINC ACETATE 2-0.1 % EX CREA
1.0000 | TOPICAL_CREAM | Freq: Three times a day (TID) | CUTANEOUS | 0 refills | Status: AC | PRN
  Filled 2024-05-22: qty 28.4, 10d supply, fill #0

## 2024-05-22 MED ORDER — CAMPHOR-MENTHOL 0.5-0.5 % EX LOTN
TOPICAL_LOTION | CUTANEOUS | 0 refills | Status: AC | PRN
Start: 2024-05-22 — End: ?
  Filled 2024-05-22: qty 222, fill #0

## 2024-05-22 MED ORDER — PREDNISONE 20 MG PO TABS
40.0000 mg | ORAL_TABLET | Freq: Every day | ORAL | 0 refills | Status: AC
  Filled 2024-05-22: qty 8, 4d supply, fill #0

## 2024-05-22 NOTE — Discharge Summary (Signed)
 Physician Discharge Summary   Patient: Sandra Atkinson MRN: [id number] DOB: 1971-08-01  Admit date:     05/20/2024  Discharge date: 05/22/24  Discharge Physician: Carliss LELON Canales   PCP: Catalina Bare, MD   Recommendations at discharge:    Pt to be discharged home.   If you experience worsening fever, chills, chest pain, shortness of breath, or other concerning symptoms, please call your PCP or go to the emergency department immediately.  Discharge Diagnoses: Principal Problem:   Drug reaction Active Problems:   Cannabis abuse   Essential hypertension with goal blood pressure less than 130/80   Dog bite, hand, right, sequela   History of substance abuse (HCC)   GERD (gastroesophageal reflux disease)   MRSA (methicillin resistant Staphylococcus aureus) infection   PTSD (post-traumatic stress disorder)   Allergic drug rash  Resolved Problems:   * No resolved hospital problems. *   Hospital Course:  53 y.o. female with medical history significant of polysubstance abuse, GERD, essential hypertension, history of recent MRSA infection, PTSD who recently had a dog bite and was admitted at [facility] for about a week on vancomycin.  Cultures grew MRSA.  Patient ultimately was given dalbavancin and discharged few days ago.  Patient came to the hospital to visit her fianc who is on admission here.  Since yesterday she has noted this diffuse rashes that are itchy.    Assessment and Plan:   Suspected drug reaction - Likely delayed reaction to dalbavancin.  Other etiologies include polysubstance use, amphetamines.  Diffuse body and extremity erythema with some vesicles/bulla noted in the feet.  No purulence, sloughing, pain at this time, only itching.  Responded well to IV Solu-Medrol , Pepcid , Benadryl .  Showed improvement in erythema.  Will transition patient to p.o. prednisone  to take for the next 4 days.  P.o. famotidine  to take as directed as well.  Topical Benadryl  to  help with itching.  Keep small bulla on feet covered and wrapped.   Recent MRSA cellulitis of right hand - Was treated with 7 days of vancomycin followed by dalbavancin.  Likely etiology of patient's diffuse erythema reaction above.  No fevers nor leukocytosis.  Will hold off on antibiotics at this time.   Polysubstance abuse - Denied active use of drugs.  Tox screen positive for amphetamines and opioids.  Encourage cessation.   PTSD - Resume home regimen.   Hypertension - Resume home regimen   Consultants: None Procedures performed: None Disposition: Home Diet recommendation:  Discharge Diet Orders (From admission, onward)     Start     Ordered   05/22/24 0000  Diet - low sodium heart healthy        05/22/24 1040           Cardiac diet  DISCHARGE MEDICATION: Allergies as of 05/22/2024       Reactions   Dalbavancin Hives, Itching, Swelling, Other (See Comments)   Required Hospitalization    Narcan [naloxone] Other (See Comments)   Seizure, cardiac arrest   Suboxone [buprenorphine Hcl-naloxone Hcl] Other (See Comments)   Seizure, cardiac arrest   Ibuprofen  Hives        Medication List     STOP taking these medications    amoxicillin -clavulanate 875-125 MG tablet Commonly known as: AUGMENTIN        TAKE these medications    albuterol 108 (90 Base) MCG/ACT inhaler Commonly known as: VENTOLIN HFA Inhale 2 puffs into the lungs every 6 (six) hours as needed for wheezing or shortness  of breath.   amLODipine 10 MG tablet Commonly known as: NORVASC Take 10 mg by mouth daily.   amphetamine-dextroamphetamine 15 MG tablet Commonly known as: ADDERALL Take 15 mg by mouth 3 (three) times daily.   camphor-menthol  lotion Commonly known as: SARNA Apply topically as needed for itching.   clotrimazole-betamethasone cream Commonly known as: LOTRISONE Apply 1 Application topically in the morning, at noon, and at bedtime.   diphenhydrAMINE -zinc acetate  cream Commonly known as: Benadryl  Extra Strength Apply 1 Application topically 3 (three) times daily as needed for itching.   divalproex 500 MG DR tablet Commonly known as: DEPAKOTE Take 500 mg by mouth 2 (two) times daily.   escitalopram 20 MG tablet Commonly known as: LEXAPRO Take 20 mg by mouth at bedtime.   famotidine  20 MG tablet Commonly known as: PEPCID  Take 1 tablet (20 mg total) by mouth 2 (two) times daily.   gabapentin 100 MG capsule Commonly known as: NEURONTIN Take 100 mg by mouth 3 (three) times daily. Nerve pain   melatonin 5 MG Tabs Take 5 mg by mouth at bedtime.   multivitamin with minerals Tabs tablet Take 1 tablet by mouth daily.   Oxycodone  HCl 10 MG Tabs Take 10 mg by mouth every 4 (four) hours as needed (Breakthrough pain).   pantoprazole 40 MG tablet Commonly known as: PROTONIX Take 40 mg by mouth daily.   pravastatin 20 MG tablet Commonly known as: PRAVACHOL Take 20 mg by mouth at bedtime.   predniSONE  20 MG tablet Commonly known as: DELTASONE  Take 2 tablets (40 mg total) by mouth daily with breakfast.   QUEtiapine 50 MG tablet Commonly known as: SEROQUEL Take 50 mg by mouth at bedtime.         Discharge Exam: Filed Weights   05/20/24 1402 05/20/24 1431  Weight: 79.4 kg 65.8 kg    GENERAL:  Alert, pleasant, no acute distress  HEENT:  EOMI CARDIOVASCULAR:  RRR, no murmurs appreciated RESPIRATORY:  Clear to auscultation, no wheezing, rales, or rhonchi GASTROINTESTINAL:  Soft, nontender, nondistended EXTREMITIES:  No LE edema bilaterally NEURO:  No new focal deficits appreciated SKIN: Markedly improved erythema in all extremities.  Pruritic.  Dorsal left foot has intact 4 cm bulla.   dorsal right foot is intact 2 cm bulla. PSYCH:  Appropriate mood and affect    Condition at discharge: improving  The results of significant diagnostics from this hospitalization (including imaging, microbiology, ancillary and laboratory) are  listed below for reference.   Imaging Studies: No results found.  Microbiology: Results for orders placed or performed during the hospital encounter of 05/20/24  MRSA Next Gen by PCR, Nasal     Status: None   Collection Time: 05/21/24  5:28 AM   Specimen: Nasal Mucosa; Nasal Swab  Result Value Ref Range Status   MRSA by PCR Next Gen NOT DETECTED NOT DETECTED Final    Comment: (NOTE) The GeneXpert MRSA Assay (FDA approved for NASAL specimens only), is one component of a comprehensive MRSA colonization surveillance program. It is not intended to diagnose MRSA infection nor to guide or monitor treatment for MRSA infections. Test performance is not FDA approved in patients less than 9 years old. Performed at [facility], [address] [place], [place] [address]     Labs: CBC: Recent Labs  Lab 05/20/24 1402 05/21/24 0531 05/22/24 0631  WBC 7.1 8.0 13.6*  NEUTROABS 5.0  --   --   HGB 10.6* 10.5* 10.5*  HCT 32.8* 32.3*  32.8*  MCV 93.7 91.2 93.2  PLT 248 237 264   Basic Metabolic Panel: Recent Labs  Lab 05/20/24 1402 05/21/24 0531 05/22/24 0631  NA 135 136 140  K 4.2 3.8 3.3*  CL 102 105 106  CO2 22 25 24   GLUCOSE 105* 153* 163*  BUN 17 13 19   CREATININE 0.77 0.63 0.72  CALCIUM 8.8* 8.6* 8.9   Liver Function Tests: Recent Labs  Lab 05/20/24 1402 05/21/24 0531  AST 72* 47*  ALT 74* 65*  ALKPHOS 64 63  BILITOT 1.0 0.6  PROT 7.7 7.6  ALBUMIN 3.4* 3.1*   CBG: No results for input(s): GLUCAP in the last 168 hours.  Discharge time spent: 27 minutes.  Length of inpatient stay: 1 days  Signed: Carliss LELON Canales, DO [facility] 05/22/2024

## 2024-05-22 NOTE — TOC Transition Note (Signed)
 Transition of Care [facility] - Discharge Note   Patient Details  Name: Sandra Atkinson MRN: [id number] Date of Birth: December 14, 1970  Transition of [facility] CM/SW Contact:  Tom-Johnson, Harvest Muskrat, RN Phone Number: 05/22/2024, 10:52 AM   Clinical Narrative:     Patient is scheduled for discharge today.  Readmission Risk Assessment done. Hospital f/u and discharge instructions on AVS. Prescriptions sent to [facility] pharmacy and patient will receive meds prior discharge. Shelter resources placed on AVS.  Cab voucher will be given at the discharge lounge to transport at discharge.  No further TOC needs noted.        Patient Goals and CMS Choice            Discharge Placement                       Discharge Plan and Services Additional resources added to the After Visit Summary for                                       Social Drivers of Health (SDOH) Interventions SDOH Screenings   Food Insecurity: Food Insecurity Present (05/21/2024)  Housing: High Risk (05/21/2024)  Transportation Needs: Unmet Transportation Needs (05/21/2024)  Utilities: At Risk (05/21/2024)  Financial Resource Strain: Medium Risk (01/03/2023)   Received from [facility]  Stress: Stress Concern Present (05/12/2024)   Received from [facility]  Tobacco Use: High Risk (05/22/2024)     Readmission Risk Interventions    05/22/2024   10:10 AM  Readmission Risk Prevention Plan  Transportation Screening Complete  PCP or Specialist Appt within 3-5 Days Complete  HRI or Home Care Consult Complete  Social Work Consult for Recovery Care Planning/Counseling Complete  Palliative Care Screening Not Applicable  Medication Review Oceanographer) Referral to Pharmacy

## 2024-05-22 NOTE — Plan of Care (Signed)
  Problem: Education: Goal: Knowledge of General Education information will improve Description: Including pain rating scale, medication(s)/side effects and non-pharmacologic comfort measures Outcome: Completed/Met   Problem: Health Behavior/Discharge Planning: Goal: Ability to manage health-related needs will improve Outcome: Completed/Met   Problem: Clinical Measurements: Goal: Ability to maintain clinical measurements within normal limits will improve Outcome: Completed/Met Goal: Will remain free from infection Outcome: Completed/Met Goal: Diagnostic test results will improve Outcome: Completed/Met Goal: Respiratory complications will improve Outcome: Completed/Met Goal: Cardiovascular complication will be avoided Outcome: Completed/Met   Problem: Activity: Goal: Risk for activity intolerance will decrease Outcome: Completed/Met   Problem: Nutrition: Goal: Adequate nutrition will be maintained Outcome: Completed/Met   Problem: Coping: Goal: Level of anxiety will decrease Outcome: Completed/Met   Problem: Elimination: Goal: Will not experience complications related to bowel motility Outcome: Completed/Met Goal: Will not experience complications related to urinary retention Outcome: Completed/Met   Problem: Pain Managment: Goal: General experience of comfort will improve and/or be controlled Outcome: Completed/Met   Problem: Safety: Goal: Ability to remain free from injury will improve Outcome: Completed/Met   Problem: Skin Integrity: Goal: Risk for impaired skin integrity will decrease Outcome: Completed/Met   Problem: Education: Goal: Knowledge of General Education information will improve Description: Including pain rating scale, medication(s)/side effects and non-pharmacologic comfort measures Outcome: Completed/Met   Problem: Health Behavior/Discharge Planning: Goal: Ability to manage health-related needs will improve Outcome: Completed/Met   Problem:  Clinical Measurements: Goal: Ability to maintain clinical measurements within normal limits will improve Outcome: Completed/Met Goal: Will remain free from infection Outcome: Completed/Met Goal: Diagnostic test results will improve Outcome: Completed/Met Goal: Respiratory complications will improve Outcome: Completed/Met Goal: Cardiovascular complication will be avoided Outcome: Completed/Met   Problem: Activity: Goal: Risk for activity intolerance will decrease Outcome: Completed/Met   Problem: Nutrition: Goal: Adequate nutrition will be maintained Outcome: Completed/Met   Problem: Coping: Goal: Level of anxiety will decrease Outcome: Completed/Met   Problem: Elimination: Goal: Will not experience complications related to bowel motility Outcome: Completed/Met Goal: Will not experience complications related to urinary retention Outcome: Completed/Met   Problem: Pain Managment: Goal: General experience of comfort will improve and/or be controlled Outcome: Completed/Met   Problem: Safety: Goal: Ability to remain free from injury will improve Outcome: Completed/Met   Problem: Skin Integrity: Goal: Risk for impaired skin integrity will decrease Outcome: Completed/Met

## 2024-05-22 NOTE — Progress Notes (Signed)
 DISCHARGE NOTE HOME NANA VASTINE to be discharged Home per MD order. Discussed prescriptions and follow up appointments with the patient. Prescriptions given to patient; medication list explained in detail. Patient verbalized understanding.  Skin clean, dry and intact without evidence of skin break down, no evidence of skin tears noted. IV catheter discontinued intact. Site without signs and symptoms of complications. Dressing and pressure applied. Pt denies pain at the site currently. No complaints noted.  Patient free of lines, drains, and wounds.   An After Visit Summary (AVS) was printed and given to the patient. Patient escorted via wheelchair, and discharged home via private auto.  Doyal Sias, RN

## 2024-05-22 NOTE — Progress Notes (Signed)
 Patient discharged. Discharged summary reviewed with patient. Patient verbalized understanding of instructions. Patient belongings returned. PIV removed. Transport notified. Patient sent to discharge lounge.
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
